# Patient Record
Sex: Female | Born: 1935 | Race: Black or African American | Hispanic: No | Smoking: Never smoker
Health system: Southern US, Community
[De-identification: ages and names within clinical notes are randomized; demographics above are authoritative.]

## PROBLEM LIST (undated history)

## (undated) DIAGNOSIS — M1990 Unspecified osteoarthritis, unspecified site: Secondary | ICD-10-CM

## (undated) DIAGNOSIS — F32A Depression, unspecified: Secondary | ICD-10-CM

## (undated) DIAGNOSIS — R51 Headache: Secondary | ICD-10-CM

## (undated) DIAGNOSIS — D649 Anemia, unspecified: Secondary | ICD-10-CM

## (undated) DIAGNOSIS — H409 Unspecified glaucoma: Secondary | ICD-10-CM

## (undated) DIAGNOSIS — I10 Essential (primary) hypertension: Secondary | ICD-10-CM

## (undated) DIAGNOSIS — K219 Gastro-esophageal reflux disease without esophagitis: Secondary | ICD-10-CM

## (undated) DIAGNOSIS — H548 Legal blindness, as defined in USA: Secondary | ICD-10-CM

## (undated) DIAGNOSIS — G20A1 Parkinson's disease without dyskinesia, without mention of fluctuations: Secondary | ICD-10-CM

## (undated) DIAGNOSIS — H269 Unspecified cataract: Secondary | ICD-10-CM

## (undated) DIAGNOSIS — G20 Parkinson's disease: Secondary | ICD-10-CM

## (undated) DIAGNOSIS — F329 Major depressive disorder, single episode, unspecified: Secondary | ICD-10-CM

## (undated) HISTORY — PX: ABDOMINAL HYSTERECTOMY: SHX81

## (undated) HISTORY — PX: EYE SURGERY: SHX253

## (undated) HISTORY — PX: COLONOSCOPY: SHX174

---

## 2004-04-27 ENCOUNTER — Ambulatory Visit: Payer: Self-pay | Admitting: Family Medicine

## 2005-08-01 ENCOUNTER — Ambulatory Visit: Payer: Self-pay | Admitting: Family Medicine

## 2005-10-20 ENCOUNTER — Emergency Department: Payer: Self-pay

## 2005-10-20 ENCOUNTER — Other Ambulatory Visit: Payer: Self-pay

## 2006-09-18 ENCOUNTER — Ambulatory Visit: Payer: Self-pay | Admitting: Family Medicine

## 2009-07-27 ENCOUNTER — Ambulatory Visit: Payer: Self-pay | Admitting: Emergency Medicine

## 2009-11-08 ENCOUNTER — Inpatient Hospital Stay: Payer: Self-pay | Admitting: Internal Medicine

## 2009-11-14 LAB — PATHOLOGY REPORT

## 2012-05-29 LAB — COMPREHENSIVE METABOLIC PANEL
Albumin: 3.4 g/dL (ref 3.4–5.0)
Alkaline Phosphatase: 59 U/L (ref 50–136)
Bilirubin,Total: 0.4 mg/dL (ref 0.2–1.0)
Calcium, Total: 10 mg/dL (ref 8.5–10.1)
Chloride: 110 mmol/L — ABNORMAL HIGH (ref 98–107)
Co2: 27 mmol/L (ref 21–32)
Creatinine: 1.01 mg/dL (ref 0.60–1.30)
EGFR (African American): >60
EGFR (Non-African Amer.): 53 — ABNORMAL LOW
Osmolality: 285 (ref 275–301)
Potassium: 4.3 mmol/L (ref 3.5–5.1)
SGPT (ALT): 25 U/L (ref 12–78)
Total Protein: 7.2 g/dL (ref 6.4–8.2)

## 2012-05-29 LAB — CBC
HGB: 14.1 g/dL (ref 12.0–16.0)
MCHC: 33.1 g/dL (ref 32.0–36.0)
MCV: 95 fL (ref 80–100)
Platelet: 160 10*3/uL (ref 150–440)
RDW: 15.7 % — ABNORMAL HIGH (ref 11.5–14.5)

## 2012-05-29 LAB — URINALYSIS, COMPLETE
Ph: 5 (ref 4.5–8.0)
RBC,UR: 1 /HPF (ref 0–5)

## 2012-05-30 ENCOUNTER — Inpatient Hospital Stay: Payer: Self-pay | Admitting: Internal Medicine

## 2012-05-30 LAB — HEMATOCRIT: HCT: 40.4 % (ref 35.0–47.0)

## 2012-05-30 LAB — HEMOGLOBIN: HGB: 13.5 g/dL (ref 12.0–16.0)

## 2012-05-31 LAB — BASIC METABOLIC PANEL
Anion Gap: 3 — ABNORMAL LOW (ref 7–16)
Creatinine: 0.93 mg/dL (ref 0.60–1.30)
EGFR (Non-African Amer.): 58 — ABNORMAL LOW
Glucose: 86 mg/dL (ref 65–99)
Osmolality: 280 (ref 275–301)
Potassium: 3.7 mmol/L (ref 3.5–5.1)
Sodium: 140 mmol/L (ref 136–145)

## 2012-05-31 LAB — CBC WITH DIFFERENTIAL/PLATELET
Basophil %: 0.7 %
Eosinophil #: 0.2 10*3/uL (ref 0.0–0.7)
Eosinophil %: 3.1 %
HGB: 12.6 g/dL (ref 12.0–16.0)
Lymphocyte #: 2.1 10*3/uL (ref 1.0–3.6)
Lymphocyte %: 40.4 %
MCH: 31.6 pg (ref 26.0–34.0)
MCHC: 33.3 g/dL (ref 32.0–36.0)
MCV: 95 fL (ref 80–100)
Monocyte #: 0.5 x10 3/mm (ref 0.2–0.9)
Monocyte %: 9.6 %
Neutrophil #: 2.4 10*3/uL (ref 1.4–6.5)
Neutrophil %: 46.2 %
Platelet: 144 10*3/uL — ABNORMAL LOW (ref 150–440)
RDW: 15.5 % — ABNORMAL HIGH (ref 11.5–14.5)
WBC: 5.1 10*3/uL (ref 3.6–11.0)

## 2013-08-20 ENCOUNTER — Encounter (HOSPITAL_COMMUNITY): Payer: Self-pay | Admitting: Pharmacy Technician

## 2013-08-28 ENCOUNTER — Other Ambulatory Visit: Payer: Self-pay | Admitting: Ophthalmology

## 2013-08-28 MED ORDER — TETRACAINE HCL 0.5 % OP SOLN: 1.0000 [drp] | OPHTHALMIC | Status: DC

## 2013-08-28 NOTE — H&P (Signed)
History & Physical:   DATE:   08-04-13  NAME:  Lydia Church, Lydia Church     [phone]       HISTORY OF PRESENT ILLNESS: Referred By Dr.A.Hedy Camaraeid Patty, Jr.    Chief Eye Complaints glaucoma  patient states that  vision is clearer some days than other OD Watery  Recently DX w/ Parkinson Disease        HPI: EYES: Reports symptoms of SEE CATARACT QUESTIONNAIRE      LOCATION:   BOTH EYES        QUALITY/COURSE:   Reports condition is worsening.        INTENSITY/SEVERITY:    Reports measurement ( or degree) as severe .      DURATION:   Reports the general length of symptoms to be years.     ACTIVE PROBLEMS: Dry eye syndrome   ICD10: H04.129  ICD9: 375.15  Onset: 08/04/2013 14:45  Initial Date:    Blindness, one eye, low vision other eye   ICD9: 369.10  Onset: 04/05/2013 11:12  Initial Date:   ICD10: H54.10  Primary open angle glaucoma   ICD9: 365.11  Onset: 04/05/2013 10:11  Initial Date:    Open-angle glaucoma, unspecified   ICD9: 365.10  Onset: 04/05/2013 10:16  Initial Date:   ICD10:      Glaucoma, severe stage   ICD9: 365.73  ICD10:   intraocular pressure too high left eye ICD10:    Parkinson's disease NOS   ICD10:   ICD9: 332.0  Onset: 08/04/2013 14:46  Initial Date:    Nuclear cataract NOS   ICD9: 366.04  Onset: 04/05/2013 10:11  Initial Date:     ICD10:  Benign hypertension   ICD9: 401.1  Onset: 04/05/2013 10:16  Initial Date:   ICD10: I10  SURGERIES: gLAUCOMA SURGERY OD  MEDICATIONS: ANTIHYPERTENSIVE Sinemet (Carbidopa/Levodopa):   10 mg-100 mg tablet  SIG-  2 tab(s)   4 times a day   Neptazane: 50 mg tablet SIG-  1 tab(s) orally 2 times a day for 30 days  Combigan: Strength-  SIG-  1 gtt OU BID  BID OU  Lumigan: Strength-  SIG-  1 gtt OU QHS   Ciloxan (Ciprofloxacin) Solution: 0.3% solution SIG-  1 drop in affected eye twice a day  REVIEW OF SYSTEMS: ROS:   GEN- Constitutional: HENT: GEN - Endocrine: Reports symptoms of diabetes.   suspect LUNGS/Respiratory:   HEART/Cardiovascular: Reports symptoms of hypertension.    ABD/Gastrointestinal:  Musculoskeletal (BJE): NEURO/Neurological: PSYCH/Psychiatric:    Is the pt oriented to time, place, person? yes Mood depressed  normal  TOBACCO: Never smoker   ICD9: V13.89 Onset: 04/05/2013 10:07 Initial Date:   Tobacco use:             SOCIAL HISTORY: RETIRED  FAMILY HISTORY: Family History - 1st Degree Relatives:  Mother dead.    ALLERGIES: Diamox cause Diarrhea.  PHYSICAL EXAMINATION: VS: BMI: 39.1.  BP: 130/86.  H: 60.00 in.  RR: 20 /min.  W: 200lbs 0oz.    Va:08/04/2013 14:29     OD:cc 20/NLP  OS:cc 20/40 +2    PH 20/NI  EYEGLASSES:  UJ:WJXBJYNOD:Balance Lens  OS:-0.25+0.50x178  ADD:2.75  MR   WU:JWJXBJY Lens OS:+0.75-1.00x106 ADD:+2.75  K's Reading 08/04/2013 14:50  OD:41.75.44.75 OS:45.00,46.25  VF:   OD:   Loss of vision in all four quadrants                                            OS: Severe constriction  Motility: full versions with slight exotropia OD  PUPILS: +2 afferent pupillary defect OD reactive OS  EYELIDS & OCULAR ADNEXA:   SLE: Conjunctiva: elevated bleb with extension onto cornea OD, +2 injection with follicles each eye  Cornea: arcus with decrease tear film each eye +2 central staining OS   anterior chamber  deep and quiet each eye  Iris: Brown each eye with no rubeosis either eye  Lens: Plus to 3 nuclear sclerosis OD, +2 Nuclear Sclerosis  Ta   in mmHg:    OD:  16         OS: 23.24   Time 08/04/2013 15:28   Gonio: OS angle open to 60 to scleral spur with +2-3 pigment high iris insertion   Dilation:   Fundus: optic nerve   OD   cOMPLETE TEMPORAL RIM LOSS                                                OS  Inferior rim loss 90% cup   Macula       OD:  Clear                 OS: Clear  Vessels: narrow arterioles  Periphery: normal     Exam: GENERAL: Appearance: HEAD, EARS, NOSE AND THROAT:  Ears-Nose (external) Inspection: Externally, nose and ears are normal in appearance and without scars, lesions, or nodules.      Hearing assessment shows no problems with normal conversation.      LUNGS and RESPIRATORY: Lung auscultation elicits no wheezing, rhonci, rales or rubs and with equal breath sounds.    Respiratory effort described as breathing is unlabored and chest movement is symmetrical.    HEART (Cardiovascular): Heart auscultation discovers regular rate and rhythm; no murmur, gallop or rub. Normal heart sounds.    ABDOMEN (Gastrointestinal): Mass/Tenderness Exam: Neither are present.     MUSCULOSKELETAL (BJE): Inspection-Palpation: No major bone, joint, tendon, or muscle changes.      NEUROLOGICAL: Alert and oriented. No major deficits of coordination or sensation.      PSYCHIATRIC: Insight and judgment appear  both to be intact and appropriate.    Mood and affect are described as normal mood and full affect.    SKIN: Skin Inspection: No rashes or lesions  ADMITTING DIAGNOSIS:Primary open angle glaucoma   ICD9: 365.11  Onset: 04/05/2013 10:11  Initial Date:     Glaucoma, severe stage   ICD9: 365.73   intraocular pressure too high left eye   Nuclear cataract NOS   ICD9: 366.04  LEFT EYE Dry eye syndrome   ICD10: H04.129  ICD9: 375.15  Onset: 08/04/2013 14:45  Initial Date:    Blindness, one eye, low vision other eye   ICD9: 369.10  Onset: 04/05/2013 11:12  Initial Date:     Parkinson's disease NOS   ICD10:   ICD9: 332.0  Onset: 08/04/2013 14:46  Initial Date:    Benign hypertension   ICD9: 401.1  Onset: 04/05/2013 10:16  Initial Date:  SURGICAL TREATMENT PLAN: phaco emulsion cataract extraction  w  intraocular lens implant  and glaucoma surgery  trabeculectomy   OS    WITH EXPRESS MINI-TUBE  Risk and benefits of Cataract and Glaucoma surgery have been reviewed with the patient, suggested that  patient  goes with glaucoma surgery if gtts and pills doesn't work.       ___________________________ Chalmers Guest, Montez Hageman Starter - Inactive Problems:

## 2013-08-31 ENCOUNTER — Encounter (HOSPITAL_COMMUNITY): Payer: Self-pay | Admitting: *Deleted

## 2013-08-31 MED ORDER — CYCLOPENTOLATE HCL 1 % OP SOLN
1.0000 [drp] | OPHTHALMIC | Status: AC
  Administered 2013-09-01: 1 [drp] via OPHTHALMIC
  Filled 2013-08-31: qty 2

## 2013-08-31 MED ORDER — KETOROLAC TROMETHAMINE 0.5 % OP SOLN
1.0000 [drp] | OPHTHALMIC | Status: AC
  Administered 2013-09-01: 1 [drp] via OPHTHALMIC
  Filled 2013-08-31: qty 5

## 2013-08-31 MED ORDER — TROPICAMIDE 1 % OP SOLN
1.0000 [drp] | OPHTHALMIC | Status: AC
  Administered 2013-09-01: 1 [drp] via OPHTHALMIC
  Filled 2013-08-31: qty 3

## 2013-08-31 MED ORDER — GATIFLOXACIN 0.5 % OP SOLN
1.0000 [drp] | OPHTHALMIC | Status: AC | PRN
  Administered 2013-09-01 (×3): 1 [drp] via OPHTHALMIC
  Filled 2013-08-31: qty 2.5

## 2013-08-31 MED ORDER — PHENYLEPHRINE HCL 2.5 % OP SOLN
1.0000 [drp] | OPHTHALMIC | Status: AC
  Administered 2013-09-01: 1 [drp] via OPHTHALMIC
  Filled 2013-08-31: qty 2

## 2013-08-31 NOTE — Progress Notes (Signed)
Pt[place]

## 2013-09-01 ENCOUNTER — Ambulatory Visit (HOSPITAL_COMMUNITY): Payer: Medicare Other

## 2013-09-01 ENCOUNTER — Ambulatory Visit (HOSPITAL_COMMUNITY)
Admission: RE | Admit: 2013-09-01 | Discharge: 2013-09-01 | Disposition: A | Discharge status: Home / Self Care | Payer: Medicare Other | Source: Ambulatory Visit | Attending: Ophthalmology | Admitting: Ophthalmology

## 2013-09-01 ENCOUNTER — Encounter (HOSPITAL_COMMUNITY): Payer: Medicare Other | Admitting: Anesthesiology

## 2013-09-01 ENCOUNTER — Encounter (HOSPITAL_COMMUNITY)
Admission: RE | Disposition: A | Discharge status: Home / Self Care | Payer: Self-pay | Source: Ambulatory Visit | Attending: Ophthalmology

## 2013-09-01 ENCOUNTER — Encounter (HOSPITAL_COMMUNITY): Payer: Self-pay | Admitting: *Deleted

## 2013-09-01 ENCOUNTER — Ambulatory Visit (HOSPITAL_COMMUNITY): Payer: Medicare Other | Admitting: Anesthesiology

## 2013-09-01 DIAGNOSIS — H04129 Dry eye syndrome of unspecified lacrimal gland: Secondary | ICD-10-CM | POA: Diagnosis not present

## 2013-09-01 DIAGNOSIS — H2510 Age-related nuclear cataract, unspecified eye: Secondary | ICD-10-CM | POA: Diagnosis present

## 2013-09-01 DIAGNOSIS — H4011X0 Primary open-angle glaucoma, stage unspecified: Secondary | ICD-10-CM | POA: Diagnosis not present

## 2013-09-01 DIAGNOSIS — G20A1 Parkinson's disease without dyskinesia, without mention of fluctuations: Secondary | ICD-10-CM | POA: Insufficient documentation

## 2013-09-01 DIAGNOSIS — H409 Unspecified glaucoma: Secondary | ICD-10-CM | POA: Insufficient documentation

## 2013-09-01 DIAGNOSIS — G20 Parkinson's disease: Secondary | ICD-10-CM | POA: Insufficient documentation

## 2013-09-01 HISTORY — DX: Unspecified glaucoma: H40.9

## 2013-09-01 HISTORY — DX: Unspecified cataract: H26.9

## 2013-09-01 HISTORY — PX: TRABECULECTOMY: SHX107

## 2013-09-01 HISTORY — PX: CATARACT EXTRACTION W/PHACO: SHX586

## 2013-09-01 HISTORY — DX: Gastro-esophageal reflux disease without esophagitis: K21.9

## 2013-09-01 HISTORY — PX: MITOMYCIN C APPLICATION: SHX6375

## 2013-09-01 HISTORY — DX: Depression, unspecified: F32.A

## 2013-09-01 HISTORY — DX: Parkinson's disease without dyskinesia, without mention of fluctuations: G20.A1

## 2013-09-01 HISTORY — DX: Parkinson's disease: G20

## 2013-09-01 HISTORY — DX: Essential (primary) hypertension: I10

## 2013-09-01 HISTORY — DX: Unspecified osteoarthritis, unspecified site: M19.90

## 2013-09-01 HISTORY — DX: Legal blindness, as defined in USA: H54.8

## 2013-09-01 HISTORY — DX: Headache: R51

## 2013-09-01 HISTORY — DX: Major depressive disorder, single episode, unspecified: F32.9

## 2013-09-01 HISTORY — DX: Anemia, unspecified: D64.9

## 2013-09-01 LAB — BASIC METABOLIC PANEL
Anion gap: 10 (ref 5–15)
BUN: 20 mg/dL (ref 6–23)
CHLORIDE: 107 meq/L (ref 96–112)
CO2: 21 meq/L (ref 19–32)
Calcium: 9.6 mg/dL (ref 8.4–10.5)
Creatinine, Ser: 0.95 mg/dL (ref 0.50–1.10)
GFR calc Af Amer: 64 mL/min — ABNORMAL LOW (ref >90)
GFR, EST NON AFRICAN AMERICAN: 55 mL/min — AB (ref >90)
GLUCOSE: 93 mg/dL (ref 70–99)
POTASSIUM: 5.7 meq/L — AB (ref 3.7–5.3)
Sodium: 138 mEq/L (ref 137–147)

## 2013-09-01 LAB — CBC
HCT: 43.7 % (ref 36.0–46.0)
Hemoglobin: 13.8 g/dL (ref 12.0–15.0)
MCH: 31.2 pg (ref 26.0–34.0)
MCHC: 31.6 g/dL (ref 30.0–36.0)
MCV: 98.6 fL (ref 78.0–100.0)
PLATELETS: 165 10*3/uL (ref 150–400)
RBC: 4.43 MIL/uL (ref 3.87–5.11)
RDW: 16.6 % — AB (ref 11.5–15.5)
WBC: 4 10*3/uL (ref 4.0–10.5)

## 2013-09-01 SURGERY — TRABECULECTOMY
Anesthesia: Monitor Anesthesia Care | Site: Eye | Laterality: Left

## 2013-09-01 MED ORDER — FLUORESCEIN SODIUM 1 MG OP STRP
ORAL_STRIP | OPHTHALMIC | Status: DC | PRN
  Administered 2013-09-01: 1 via OPHTHALMIC

## 2013-09-01 MED ORDER — PROPOFOL 10 MG/ML IV BOLUS
INTRAVENOUS | Status: AC
  Filled 2013-09-01: qty 20

## 2013-09-01 MED ORDER — FENTANYL CITRATE 0.05 MG/ML IJ SOLN
INTRAMUSCULAR | Status: DC | PRN
  Administered 2013-09-01: 50 ug via INTRAVENOUS
  Administered 2013-09-01 (×2): 25 ug via INTRAVENOUS

## 2013-09-01 MED ORDER — CYCLOPENTOLATE HCL 1 % OP SOLN
1.0000 [drp] | OPHTHALMIC | Status: AC
  Administered 2013-09-01 (×2): 1 [drp] via OPHTHALMIC
  Filled 2013-09-01: qty 2

## 2013-09-01 MED ORDER — BSS IO SOLN
INTRAOCULAR | Status: DC | PRN
  Administered 2013-09-01: 500 mL via INTRAOCULAR

## 2013-09-01 MED ORDER — FLUORESCEIN SODIUM 1 MG OP STRP
ORAL_STRIP | OPHTHALMIC | Status: AC
  Filled 2013-09-01: qty 1

## 2013-09-01 MED ORDER — BSS IO SOLN
INTRAOCULAR | Status: AC
  Filled 2013-09-01: qty 500

## 2013-09-01 MED ORDER — 0.9 % SODIUM CHLORIDE (POUR BTL) OPTIME
TOPICAL | Status: DC | PRN
  Administered 2013-09-01: 1000 mL

## 2013-09-01 MED ORDER — LIDOCAINE HCL (CARDIAC) 20 MG/ML IV SOLN
INTRAVENOUS | Status: AC
  Filled 2013-09-01: qty 5

## 2013-09-01 MED ORDER — TROPICAMIDE 1 % OP SOLN
1.0000 [drp] | OPHTHALMIC | Status: AC
  Administered 2013-09-01 (×2): 1 [drp] via OPHTHALMIC
  Filled 2013-09-01: qty 2

## 2013-09-01 MED ORDER — LIDOCAINE HCL (CARDIAC) 20 MG/ML IV SOLN
INTRAVENOUS | Status: DC | PRN
  Administered 2013-09-01: 50 mg via INTRAVENOUS

## 2013-09-01 MED ORDER — TRIAMCINOLONE ACETONIDE 40 MG/ML IJ SUSP
INTRAMUSCULAR | Status: AC
  Filled 2013-09-01: qty 5

## 2013-09-01 MED ORDER — NA CHONDROIT SULF-NA HYALURON 40-30 MG/ML IO SOLN
INTRAOCULAR | Status: AC
  Filled 2013-09-01: qty 0.5

## 2013-09-01 MED ORDER — ONDANSETRON HCL 4 MG/2ML IJ SOLN
INTRAMUSCULAR | Status: DC | PRN
  Administered 2013-09-01: 4 mg via INTRAVENOUS

## 2013-09-01 MED ORDER — PHENYLEPHRINE HCL 10 MG/ML IJ SOLN
INTRAMUSCULAR | Status: DC | PRN
  Administered 2013-09-01 (×2): 40 ug via INTRAVENOUS
  Administered 2013-09-01: 80 ug via INTRAVENOUS

## 2013-09-01 MED ORDER — SODIUM CHLORIDE 0.9 % IV SOLN
INTRAVENOUS | Status: DC
  Administered 2013-09-01: 11:00:00 via INTRAVENOUS

## 2013-09-01 MED ORDER — KETOROLAC TROMETHAMINE 0.5 % OP SOLN
1.0000 [drp] | OPHTHALMIC | Status: AC
  Administered 2013-09-01 (×2): 1 [drp] via OPHTHALMIC
  Filled 2013-09-01: qty 3

## 2013-09-01 MED ORDER — ACETYLCHOLINE CHLORIDE 1:100 IO SOLR
INTRAOCULAR | Status: DC | PRN
  Administered 2013-09-01: 10 mg via INTRAOCULAR

## 2013-09-01 MED ORDER — FLUORESCEIN SODIUM 1 MG OP STRP
ORAL_STRIP | OPHTHALMIC | Status: AC
  Filled 2013-09-01: qty 2

## 2013-09-01 MED ORDER — LIDOCAINE-EPINEPHRINE 2 %-1:100000 IJ SOLN
INTRAMUSCULAR | Status: DC | PRN
  Administered 2013-09-01: 14:00:00 via RETROBULBAR

## 2013-09-01 MED ORDER — TRIAMCINOLONE ACETONIDE 40 MG/ML IJ SUSP
INTRAMUSCULAR | Status: DC | PRN
  Administered 2013-09-01: .1 mL

## 2013-09-01 MED ORDER — BALANCED SALT IO SOLN
INTRAOCULAR | Status: DC | PRN
  Administered 2013-09-01: 15 mL via INTRAOCULAR

## 2013-09-01 MED ORDER — NA CHONDROIT SULF-NA HYALURON 40-30 MG/ML IO SOLN
INTRAOCULAR | Status: DC | PRN
  Administered 2013-09-01: 0.75 mL via INTRAOCULAR

## 2013-09-01 MED ORDER — SODIUM HYALURONATE 10 MG/ML IO SOLN
INTRAOCULAR | Status: DC | PRN
  Administered 2013-09-01: 0.85 mL via INTRAOCULAR

## 2013-09-01 MED ORDER — PHENYLEPHRINE 40 MCG/ML (10ML) SYRINGE FOR IV PUSH (FOR BLOOD PRESSURE SUPPORT)
PREFILLED_SYRINGE | INTRAVENOUS | Status: AC
  Filled 2013-09-01: qty 10

## 2013-09-01 MED ORDER — LIDOCAINE-EPINEPHRINE 2 %-1:100000 IJ SOLN
INTRAMUSCULAR | Status: AC
  Filled 2013-09-01: qty 1

## 2013-09-01 MED ORDER — FENTANYL CITRATE 0.05 MG/ML IJ SOLN
INTRAMUSCULAR | Status: AC
  Filled 2013-09-01: qty 5

## 2013-09-01 MED ORDER — SODIUM HYALURONATE 10 MG/ML IO SOLN
INTRAOCULAR | Status: AC
  Filled 2013-09-01: qty 0.85

## 2013-09-01 MED ORDER — MITOMYCIN 0.2 MG OP KIT
0.2000 mg | PACK | OPHTHALMIC | Status: AC
  Administered 2013-09-01: 0.2 mg via OPHTHALMIC
  Filled 2013-09-01: qty 1

## 2013-09-01 MED ORDER — ONDANSETRON HCL 4 MG/2ML IJ SOLN
INTRAMUSCULAR | Status: AC
  Filled 2013-09-01: qty 2

## 2013-09-01 MED ORDER — PROPOFOL 10 MG/ML IV BOLUS
INTRAVENOUS | Status: DC | PRN
  Administered 2013-09-01: 40 mg via INTRAVENOUS

## 2013-09-01 MED ORDER — TOBRAMYCIN 0.3 % OP OINT
TOPICAL_OINTMENT | OPHTHALMIC | Status: DC | PRN
  Administered 2013-09-01: 1 via OPHTHALMIC

## 2013-09-01 MED ORDER — SODIUM CHLORIDE 0.9 % IV SOLN
INTRAVENOUS | Status: DC | PRN
  Administered 2013-09-01 (×2): via INTRAVENOUS

## 2013-09-01 MED ORDER — PHENYLEPHRINE HCL 2.5 % OP SOLN
1.0000 [drp] | OPHTHALMIC | Status: AC
  Administered 2013-09-01 (×2): 1 [drp] via OPHTHALMIC
  Filled 2013-09-01: qty 2

## 2013-09-01 MED ORDER — PROPOFOL INFUSION 10 MG/ML OPTIME
INTRAVENOUS | Status: DC | PRN
Start: 2013-09-01 — End: 2013-09-01
  Administered 2013-09-01: 25 ug/kg/min via INTRAVENOUS

## 2013-09-01 SURGICAL SUPPLY — 52 items
APPLICATOR COTTON TIP 6IN STRL (MISCELLANEOUS) ×3 IMPLANT
APPLICATOR DR MATTHEWS STRL (MISCELLANEOUS) ×3 IMPLANT
BLADE 10 SAFETY STRL DISP (BLADE) ×3 IMPLANT
BLADE EYE CATARACT 19 1.4 BEAV (BLADE) ×3 IMPLANT
BLADE KERATOME 2.75 (BLADE) ×2 IMPLANT
BLADE KERATOME 2.75MM (BLADE) ×1
BLADE MINI RND TIP GREEN BEAV (BLADE) ×3 IMPLANT
BLADE NEEDLE 3 SS STRL (BLADE) ×2 IMPLANT
BLADE NEEDLE 3MM SS STRL (BLADE) ×1
BLADE STAB KNIFE 45DEG (BLADE) ×3 IMPLANT
CANISTER SUCTION 2500CC (MISCELLANEOUS) IMPLANT
CANNULA ANTERIOR CHAMBER 27GA (MISCELLANEOUS) ×3 IMPLANT
CORDS BIPOLAR (ELECTRODE) ×3 IMPLANT
COVER MAYO STAND STRL (DRAPES) IMPLANT
DRAPE OPHTHALMIC 40X48 W POUCH (DRAPES) ×3 IMPLANT
DRAPE RETRACTOR (MISCELLANEOUS) ×3 IMPLANT
ERASER HMR WETFIELD 23G BP (MISCELLANEOUS) ×3 IMPLANT
GLOVE BIO SURGEON STRL SZ7.5 (GLOVE) ×6 IMPLANT
GLOVE BIO SURGEON STRL SZ8 (GLOVE) ×6 IMPLANT
GLOVE ECLIPSE 7.0 STRL STRAW (GLOVE) ×3 IMPLANT
GLOVE SURG SS PI 7.0 STRL IVOR (GLOVE) ×3 IMPLANT
GOWN STRL REIN XL XLG (GOWN DISPOSABLE) ×9 IMPLANT
GOWN STRL REUS W/ TWL LRG LVL3 (GOWN DISPOSABLE) ×2 IMPLANT
GOWN STRL REUS W/ TWL XL LVL3 (GOWN DISPOSABLE) ×1 IMPLANT
GOWN STRL REUS W/TWL LRG LVL3 (GOWN DISPOSABLE) ×4
GOWN STRL REUS W/TWL XL LVL3 (GOWN DISPOSABLE) ×2
KIT BASIN OR (CUSTOM PROCEDURE TRAY) ×3 IMPLANT
KIT ROOM TURNOVER OR (KITS) ×3 IMPLANT
KNIFE GRIESHABER SHARP 2.5MM (MISCELLANEOUS) ×3 IMPLANT
LENS IOL ACRSF IQ PC 19.5 (Intraocular Lens) ×1 IMPLANT
LENS IOL ACRYSOF IQ POST 19.5 (Intraocular Lens) ×3 IMPLANT
NEEDLE 25GX 5/8IN NON SAFETY (NEEDLE) ×3 IMPLANT
NEEDLE HYPO 30X.5 LL (NEEDLE) ×3 IMPLANT
NS IRRIG 1000ML POUR BTL (IV SOLUTION) ×3 IMPLANT
PACK CATARACT CUSTOM (CUSTOM PROCEDURE TRAY) ×6 IMPLANT
PAD ARMBOARD 7.5X6 YLW CONV (MISCELLANEOUS) ×6 IMPLANT
SHUNT EXPRS GLAUCOMA MINI P200 (Intraocular Lens) ×3 IMPLANT
SPEAR EYE SURG WECK-CEL (MISCELLANEOUS) ×3 IMPLANT
SPECIMEN JAR SMALL (MISCELLANEOUS) IMPLANT
SUT ETHILON 10 0 CS140 6 (SUTURE) ×3 IMPLANT
SUT ETHILON 9 0 BV100 4 (SUTURE) ×3 IMPLANT
SUT SILK 6 0 G 6 (SUTURE) ×3 IMPLANT
SUT VICRYL 9-0 (SUTURE) ×3 IMPLANT
SYR 20CC LL (SYRINGE) ×6 IMPLANT
SYR 50ML SLIP (SYRINGE) ×3 IMPLANT
SYR TB 1ML LUER SLIP (SYRINGE) IMPLANT
TIP ABS 45DEG FLARED 0.9MM (TIP) ×3 IMPLANT
TOWEL OR 17X24 6PK STRL BLUE (TOWEL DISPOSABLE) ×6 IMPLANT
TUBE CONNECTING 12'X1/4 (SUCTIONS)
TUBE CONNECTING 12X1/4 (SUCTIONS) IMPLANT
WATER STERILE IRR 1000ML POUR (IV SOLUTION) ×3 IMPLANT
WIPE INSTRUMENT VISIWIPE 73X73 (MISCELLANEOUS) ×6 IMPLANT

## 2013-09-01 NOTE — Progress Notes (Signed)
09/01/13 1008  OBSTRUCTIVE SLEEP APNEA  Score 4 or greater  Results sent to PCP

## 2013-09-01 NOTE — Anesthesia Postprocedure Evaluation (Signed)
  Anesthesia Post-op Note  Patient: Lydia Church  Procedure(s) Performed: Procedure(s): TRABECULECTOMY LEFT EYE (Left) MITOMYCIN C APPLICATION (Left) CATARACT EXTRACTION PHACO AND INTRAOCULAR LENS PLACEMENT (IOC) (Left)  Patient Location: PACU  Anesthesia Type:MAC  Level of Consciousness: awake  Airway and Oxygen Therapy: Patient Spontanous Breathing  Post-op Pain: mild  Post-op Assessment: Post-op Vital signs reviewed  Post-op Vital Signs: Reviewed  Last Vitals:  Filed Vitals:   09/01/13 0908  BP: 167/58  Pulse: 50  Temp: 36.2 C  Resp: 18    Complications: No apparent anesthesia complications

## 2013-09-01 NOTE — Anesthesia Preprocedure Evaluation (Addendum)
Anesthesia Evaluation  Patient identified by MRN, date of birth, ID band Patient awake    Reviewed: Allergy & Precautions, H&P , NPO status , Patient's Chart, lab work & pertinent test results  Airway Mallampati: I TM Distance: >3 FB Neck ROM: Full    Dental  (+) Dental Advisory Given, Poor Dentition   Pulmonary neg pulmonary ROS,  breath sounds clear to auscultation        Cardiovascular hypertension, Pt. on medications Rhythm:Regular Rate:Normal     Neuro/Psych  Headaches, Pt has an involuntary gnawing motion of mouth and lower jaw...    GI/Hepatic Neg liver ROS, GERD-  ,Occasional gas and acid if eats spicy food   Endo/Other    Renal/GU negative Renal ROS     Musculoskeletal   Abdominal   Peds  Hematology   Anesthesia Other Findings Pt c/o lower right molar hurting ; many broken, worn, and missing  Reproductive/Obstetrics                         Anesthesia Physical Anesthesia Plan  ASA: III  Anesthesia Plan: MAC   Post-op Pain Management:    Induction: Intravenous  Airway Management Planned: Simple Face Mask  Additional Equipment:   Intra-op Plan:   Post-operative Plan:   Informed Consent: I have reviewed the patients History and Physical, chart, labs and discussed the procedure including the risks, benefits and alternatives for the proposed anesthesia with the patient or authorized representative who has indicated his/her understanding and acceptance.   Dental advisory given  Plan Discussed with: CRNA, Anesthesiologist and Surgeon  Anesthesia Plan Comments:         Anesthesia Quick Evaluation

## 2013-09-01 NOTE — Progress Notes (Signed)
09/01/13 1005  OBSTRUCTIVE SLEEP APNEA  Have you ever been diagnosed with sleep apnea through a sleep study? No  Do you snore loudly (loud enough to be heard through closed doors)?  0  Do you often feel tired, fatigued, or sleepy during the daytime? 1  Has anyone observed you stop breathing during your sleep? 0  Do you have, or are you being treated for high blood pressure? 1  BMI more than 35 kg/m2? 1  Age over 78 years old? 1  Neck circumference greater than 40 cm/16 inches? 0  Obstructive Sleep Apnea Score 4  Score 4 or greater  Results sent to PCP

## 2013-09-01 NOTE — Anesthesia Postprocedure Evaluation (Signed)
  Anesthesia Post-op Note  Patient: Lydia Church  Procedure(s) Performed: Procedure(s): TRABECULECTOMY LEFT EYE (Left) MITOMYCIN C APPLICATION (Left) CATARACT EXTRACTION PHACO AND INTRAOCULAR LENS PLACEMENT (IOC) (Left)  Patient Location: PACU  Anesthesia Type:MAC  Level of Consciousness: awake  Airway and Oxygen Therapy: Patient Spontanous Breathing  Post-op Pain: mild  Post-op Assessment: Post-op Vital signs reviewed  Post-op Vital Signs: Reviewed  Last Vitals:  Filed Vitals:   09/01/13 1515  BP: 126/54  Pulse: 48  Temp:   Resp: 16    Complications: No apparent anesthesia complications

## 2013-09-01 NOTE — Interval H&P Note (Signed)
History and Physical Interval Note:  09/01/2013 12:19 PM  Lydia Church  has presented today for surgery, with the diagnosis of Nuclear cataract, nonsenile, left eye/Glaucoma left eye  The various methods of treatment have been discussed with the patient and family. After consideration of risks, benefits and other options for treatment, the patient has consented to  Procedure(s): TRABECULECTOMY LEFT EYE (Left) MITOMYCIN C APPLICATION (Left) as a surgical intervention .  The patient's history has been reviewed, patient examined, no change in status, stable for surgery.  I have reviewed the patient's chart and labs.  Questions were answered to the patient's satisfaction.     Sebrina Kessner

## 2013-09-01 NOTE — Discharge Instructions (Signed)
The patient may remove the eye patch on her eye at 5:00 today to not apply any pressure to the eye. The patient should where eyeglasses or sunglasses over her eye at all times avoid straining lifting bending. When the patient goes asleep at night she should use an eye shield a plastic eye she'll over the eye sleep on back or right side not on left eye. The patient notices pain she may take her usual pain medications this does not relieve the pain she should call the doctor's office tonight.

## 2013-09-01 NOTE — Transfer of Care (Signed)
Immediate Anesthesia Transfer of Care Note  Patient: Lydia Church  Procedure(s) Performed: Procedure(s): TRABECULECTOMY LEFT EYE (Left) MITOMYCIN C APPLICATION (Left) CATARACT EXTRACTION PHACO AND INTRAOCULAR LENS PLACEMENT (IOC) (Left)  Patient Location: PACU  Anesthesia Type:MAC  Level of Consciousness: awake, alert , oriented and patient cooperative  Airway & Oxygen Therapy: Patient Spontanous Breathing and Patient connected to nasal cannula oxygen  Post-op Assessment: Report given to PACU RN, Post -op Vital signs reviewed and stable and Patient moving all extremities  Post vital signs: Reviewed and stable  Complications: No apparent anesthesia complications

## 2013-09-01 NOTE — Interval H&P Note (Signed)
History and Physical Interval Note:  09/01/2013 12:17 PM  Lydia Church  has presented today for surgery, with the diagnosis of Nuclear cataract, nonsenile, left eye/Glaucoma left eye  The various methods of treatment have been discussed with the patient and family. After consideration of risks, benefits and other options for treatment, the patient has consented to  Procedure(s): TRABECULECTOMY LEFT EYE (Left) MITOMYCIN C APPLICATION (Left) as a surgical intervention .  The patient's history has been reviewed, patient examined, no change in status, stable for surgery.  I have reviewed the patient's chart and labs.  Questions were answered to the patient's satisfaction.     Thresia Ramanathan

## 2013-09-01 NOTE — Op Note (Signed)
Preoperative diagnosis: Uncontrolled glaucoma in visually significant cataract left eye Postoperative diagnosis: Same Procedure phacoemulsification with intraocular lens implant and insertion of or coma express device with mitomycin-C all left eye Anesthesia: 2% Xylocaine with epinephrine a 50-50 mixture 0.75% Marcaine with ample Wydase Complications: None Assistant: Milee Procedure: The patient transferred to the operating room where she was given a peribulbar block with the aforementioned local anesthetic agent. Following this the patient's face prepped and draped in the usual sterile fashion with a surgeon initially sitting temporally and the operating microscope in position a Weck-Cel sponges used to fixate the globe and a 15 blade was used to enter through inferior clear cornea Viscoat was injected into the anterior chamber additional Weck-Cel sponge was used to fixate the globe and a 2.75 mm keratome blade was used in a stepwise fashion through temporal clear cornea to into the chamber additional Viscoat was injected. A bent 25-gauge needle was used to incise anterior capsule and a continuous tear curvilinear capsulorrhexis was formed. BSS was used to hydrodissect and hydrodelineate the nucleus and the nucleus was noted to rise out of capsular bag. The phacoemulsification unit was then used to sculpt the nucleus into 4 quadrants quadrants were divided with a Kuglen hook and phaco tip all nuclear fragments were removed from the eye and the posterior capsule remained intact. The irrigation aspiration device was then used to strip cortical fibrous and the posterior capsule after all cortical fibers had been removed Provisc was injected in the anterior chamber. The intraocular lens implant was examined and noted to have no defects the lens was an Alcon AcrySof SN 60 WF IQ lens 19.5 diopter SN #[id number] the lens is placed in the lens injector and injected in the capsular bag. It was position with a  Kuglen hook the I/A was then used to remove a small amount of viscoelastic Miochol was injected in the eye and the pupil was noted to come down symmetrically round. A single 10-0 nylon suture was placed to achieve watertight closure the incision at this point the operating microscope was rotated to the 12:00 position a 6-0 nylon suture was passed through superior clear cornea to infraducted the eye. Following this the incision was made superior nasal using a shot Wescott scissors the blunt Wescott and then used to bluntly dissect forming a fornix-based conjunctival flap T9 fibers were recess use and agrees our blade bleeding was controlled with cautery following this using a 45 blade a half thickness scleral flap was fashioned using Colibri forceps and agrees our blade the scleral flap was dissected to the limbus following this mitomycin-C 0.4 mg per cc were placed onto Gelfoam sponges the sponges were placed and the the conjunctiva and allowed to stay on the eye for 3 minutes the sponges were then removed and eye was irrigated with 40 cc of balanced salt solution following this the scleral flap was elevated and using a 26-gauge needle attached to Provisc in the anterior chamber was entered with the needle beneath the scleral flap at the scleral spur. Provisc was injected in the eye. Following this the express glaucoma device was noted to have no defects it was taken from the package the express was a version P2 100 SN #[id number] the express device was positioned through the aforementioned tract made with a 26-gauge needle the scleral flap was then sutured with 3 interrupted 10-0 nylon sutures the conjunctiva was then closed with a 9-0 Vicryl suture on a BV 100 needle BSS was injected the paracentesis  flap to cause some of the Provisc to egress from the paracentesis tract. The bleb elevated the incision was Seidel negative therefore subconjunctival injection of Kenalog 4 mg is given in the inferior subconjunctival  space topical TobraDex ointment was applied to the eye all sutures instruments were removed from the eye a patch and Fox U. were placed and the patient returned to recovery area in stable condition [employer] Indra Wolters Junior

## 2013-09-01 NOTE — H&P (View-Only) (Signed)
                  History & Physical:   DATE:   08-04-13  NAME:  Church, Lydia M     [id number]       HISTORY OF PRESENT ILLNESS: Referred By Dr.A.Reid Patty, Jr.    Chief Eye Complaints glaucoma  patient states that  vision is clearer some days than other OD Watery  Recently DX w/ Parkinson Disease        HPI: EYES: Reports symptoms of SEE CATARACT QUESTIONNAIRE      LOCATION:   BOTH EYES        QUALITY/COURSE:   Reports condition is worsening.        INTENSITY/SEVERITY:    Reports measurement ( or degree) as severe .      DURATION:   Reports the general length of symptoms to be years.     ACTIVE PROBLEMS: Dry eye syndrome   ICD10: H04.129  ICD9: 375.15  Onset: 08/04/2013 14:45  Initial Date:    Blindness, one eye, low vision other eye   ICD9: 369.10  Onset: 04/05/2013 11:12  Initial Date:   ICD10: H54.10  Primary open angle glaucoma   ICD9: 365.11  Onset: 04/05/2013 10:11  Initial Date:    Open-angle glaucoma, unspecified   ICD9: 365.10  Onset: 04/05/2013 10:16  Initial Date:   ICD10:      Glaucoma, severe stage   ICD9: 365.73  ICD10:   intraocular pressure too high left eye ICD10:    Parkinson's disease NOS   ICD10:   ICD9: 332.0  Onset: 08/04/2013 14:46  Initial Date:    Nuclear cataract NOS   ICD9: 366.04  Onset: 04/05/2013 10:11  Initial Date:     ICD10:  Benign hypertension   ICD9: 401.1  Onset: 04/05/2013 10:16  Initial Date:   ICD10: I10  SURGERIES: gLAUCOMA SURGERY OD  MEDICATIONS: ANTIHYPERTENSIVE Sinemet (Carbidopa/Levodopa):   10 mg-100 mg tablet  SIG-  2 tab(s)   4 times a day   Neptazane: 50 mg tablet SIG-  1 tab(s) orally 2 times a day for 30 days  Combigan: Strength-  SIG-  1 gtt OU BID  BID OU  Lumigan: Strength-  SIG-  1 gtt OU QHS   Ciloxan (Ciprofloxacin) Solution: 0.3% solution SIG-  1 drop in affected eye twice a day  REVIEW OF SYSTEMS: ROS:   GEN- Constitutional: HENT: GEN - Endocrine: Reports symptoms of diabetes.   suspect LUNGS/Respiratory:   HEART/Cardiovascular: Reports symptoms of hypertension.    ABD/Gastrointestinal:  Musculoskeletal (BJE): NEURO/Neurological: PSYCH/Psychiatric:    Is the pt oriented to time, place, person? yes Mood depressed  normal  TOBACCO: Never smoker   ICD9: V13.89 Onset: 04/05/2013 10:07 Initial Date:   Tobacco use:             SOCIAL HISTORY: RETIRED  FAMILY HISTORY: Family History - 1st Degree Relatives:  Mother dead.    ALLERGIES: Diamox cause Diarrhea.  PHYSICAL EXAMINATION: VS: BMI: 39.1.  BP: 130/86.  H: 60.00 in.  RR: 20 /min.  W: 200lbs 0oz.    Va:08/04/2013 14:29     OD:cc 20/NLP  OS:cc 20/40 +2    PH 20/NI  EYEGLASSES:  OD:Balance Lens                                                 OS:-0.25+0.50x178  ADD:2.75  MR   OD:Balance Lens OS:+0.75-1.00x106 ADD:+2.75  K's Reading 08/04/2013 14:50  OD:41.75.44.75 OS:45.00,46.25  VF:   OD:   Loss of vision in all four quadrants                                            OS: Severe constriction  Motility: full versions with slight exotropia OD  PUPILS: +2 afferent pupillary defect OD reactive OS  EYELIDS & OCULAR ADNEXA:   SLE: Conjunctiva: elevated bleb with extension onto cornea OD, +2 injection with follicles each eye  Cornea: arcus with decrease tear film each eye +2 central staining OS   anterior chamber  deep and quiet each eye  Iris: Brown each eye with no rubeosis either eye  Lens: Plus to 3 nuclear sclerosis OD, +2 Nuclear Sclerosis  Ta   in mmHg:    OD:  16         OS: 23.24   Time 08/04/2013 15:28   Gonio: OS angle open to 60 to scleral spur with +2-3 pigment high iris insertion   Dilation:   Fundus: optic nerve   OD   cOMPLETE TEMPORAL RIM LOSS                                                OS  Inferior rim loss 90% cup   Macula       OD:  Clear                 OS: Clear  Vessels: narrow arterioles  Periphery: normal     Exam: GENERAL: Appearance: HEAD, EARS, NOSE AND THROAT:  Ears-Nose (external) Inspection: Externally, nose and ears are normal in appearance and without scars, lesions, or nodules.      Hearing assessment shows no problems with normal conversation.      LUNGS and RESPIRATORY: Lung auscultation elicits no wheezing, rhonci, rales or rubs and with equal breath sounds.    Respiratory effort described as breathing is unlabored and chest movement is symmetrical.    HEART (Cardiovascular): Heart auscultation discovers regular rate and rhythm; no murmur, gallop or rub. Normal heart sounds.    ABDOMEN (Gastrointestinal): Mass/Tenderness Exam: Neither are present.     MUSCULOSKELETAL (BJE): Inspection-Palpation: No major bone, joint, tendon, or muscle changes.      NEUROLOGICAL: Alert and oriented. No major deficits of coordination or sensation.      PSYCHIATRIC: Insight and judgment appear  both to be intact and appropriate.    Mood and affect are described as normal mood and full affect.    SKIN: Skin Inspection: No rashes or lesions  ADMITTING DIAGNOSIS:Primary open angle glaucoma   ICD9: 365.11  Onset: 04/05/2013 10:11  Initial Date:     Glaucoma, severe stage   ICD9: 365.73   intraocular pressure too high left eye   Nuclear cataract NOS   ICD9: 366.04  LEFT EYE Dry eye syndrome   ICD10: H04.129  ICD9: 375.15  Onset: 08/04/2013 14:45  Initial Date:    Blindness, one eye, low vision other eye   ICD9: 369.10  Onset: 04/05/2013 11:12  Initial Date:     Parkinson's disease NOS   ICD10:   ICD9: 332.0  Onset: 08/04/2013 14:46    Initial Date:    Benign hypertension   ICD9: 401.1  Onset: 04/05/2013 10:16  Initial Date:  SURGICAL TREATMENT PLAN: phaco emulsion cataract extraction  w  intraocular lens implant  and glaucoma surgery  trabeculectomy   OS    WITH EXPRESS MINI-TUBE  Risk and benefits of Cataract and Glaucoma surgery have been reviewed with the patient, suggested that  patient  goes with glaucoma surgery if gtts and pills doesn't work.       ___________________________ Fredy Gladu, Jr. Starter - Inactive Problems:  

## 2013-09-02 ENCOUNTER — Encounter (HOSPITAL_COMMUNITY): Payer: Self-pay | Admitting: Ophthalmology

## 2014-05-13 NOTE — Consult Note (Signed)
Chief Complaint:  Subjective/Chief Complaint Patient with a large amount of rectal bleeding with nausea and shoulder pain. The patient is planned to have a CT scan of the abd today.   Brief Assessment:  Respiratory normal resp effort   Additional Physical Exam NADA&O x 3   Lab Results: Routine Chem:  11-May-14 04:31   Glucose, Serum 86  BUN 15  Creatinine (comp) 0.93  Sodium, Serum 140  Potassium, Serum 3.7  Chloride, Serum  108  CO2, Serum 29  Calcium (Total), Serum 9.4  Anion Gap  3  Osmolality (calc) 280  eGFR (African American) >60  eGFR (Non-African American)  58 (eGFR values <27m/min/1.73 m2 may be an indication of chronic kidney disease (CKD). Calculated eGFR is useful in patients with stable renal function. The eGFR calculation will not be reliable in acutely ill patients when serum creatinine is changing rapidly. It is not useful in  patients on dialysis. The eGFR calculation may not be applicable to patients at the low and high extremes of body sizes, pregnant women, and vegetarians.)  Routine Hem:  11-May-14 04:31   WBC (CBC) 5.1  RBC (CBC) 3.97  Hemoglobin (CBC) 12.6  Hematocrit (CBC) 37.7  Platelet Count (CBC)  144  MCV 95  MCH 31.6  MCHC 33.3  RDW  15.5  Neutrophil % 46.2  Lymphocyte % 40.4  Monocyte % 9.6  Eosinophil % 3.1  Basophil % 0.7  Neutrophil # 2.4  Lymphocyte # 2.1  Monocyte # 0.5  Eosinophil # 0.2  Basophil # 0.0 (Result(s) reported on 31 May 2012 at 05:24AM.)   Assessment/Plan:  Assessment/Plan:  Assessment Lower Gi bleed with nausea.   Plan Follow Hb. Prep for colonoscopy for tomorrow.   Electronic Signatures: WLucilla Lame(MD)  (Signed 11-May-14 12:19)  Authored: Chief Complaint, Brief Assessment, Lab Results, Assessment/Plan   Last Updated: 11-May-14 12:19 by WLucilla Lame(MD)

## 2014-05-13 NOTE — H&P (Signed)
PATIENT NAME:  Lydia Church, Lydia M MR#:  [id number] DATE OF BIRTH:  1930/11/22  DATE OF ADMISSION:  05/30/2012  PRIMARY CARE PHYSICIAN:  Dr. Cherly HensenBernadette Page.  REFERRING PHYSICIAN:  Maricela BoLuna Ragsdale, MD  CHIEF COMPLAINT:  GI bleed.  HISTORY OF PRESENT ILLNESS:  The patient is a 81 year old African-American female with a past medical history of hyperlipidemia, hypertension and chronic glaucoma who is presenting to the ER with a chief complaint of a 1-day history of lower GI bleed.  It started at around 8 to 9 a.m. yesterday and so far she has experienced 3 to 4 episodes of bouts of some fresh blood as well as some dark blood.  She denies any abdominal pain.  No nausea or vomiting.  She had had a similar problem in the past in the ER in 2011.  At that time, the patient had esophagogastroduodenoscopy and colonoscopy done.  Colonoscopy had revealed diverticulosis and internal hemorrhoids.  At the time of my examination, the patient is comfortable and denies any complaints.    PAST MEDICAL HISTORY:  Hypertension, hyperlipidemia and history of glaucoma.    PAST SURGICAL HISTORY: Hysterectomy.  ALLERGIES:  The patient has no known drug allergies.  HOME MEDICATIONS: MiraLAX 17 grams once daily,  lovastatin 20 mg once daily, Cozaar 25 mg once daily, fexofenadine 60 mg b.i.d.  PSYCHOSOCIAL HISTORY:  Lives at home.  FAMILY HISTORY:  Mother had a history of hypertension.  REVIEW OF SYSTEMS:   CONSTITUTIONAL: Denies any fever, fatigue, weakness. No pain, no weight loss or weight gain.  EYES: No blurry vision, history of glaucoma. EARS, NOSE, THROAT: Denies tinnitus, epistaxis, discharge, difficulty swallowing. RESPIRATORY:  Denies any cough, painful respirations or COPD. CARDIOVASCULAR: No chest pain, palpitations or syncope.  GASTROINTESTINAL: Denies any nausea, vomiting, but complaining of loose bowel movements with fresh and somewhat dark colored blood as well.  Denies any jaundice or  constipation. GENITOURINARY: No dysuria, hematuria.  No breast masses or vaginal discharge. ENDOCRINE:  No polyuria, nocturia, thyroid problems or increased sweating.   HEMATOLOGIC/LYMPHATIC:  No anemia or easy bruising.  Positive rectal bleed. INTEGUMENTARY: No acne, rash, lesions.  MUSCULOSKELETAL: No joint pain in the neck, back, shoulder. Denies any gout.  NEUROLOGIC: No vertigo, ataxia, CVA, TIA.  PSYCHIATRIC:  Denies any ADD, OCD, bipolar disorder.  The patient denies any schizophrenia or nervousness.  PHYSICAL EXAMINATION: VITAL SIGNS: Temperature 979, pulse 64, respirations 16, blood pressure 143/71, pulse ox 96%. GENERAL:  Not in acute distress. Moderately built and moderately nourished.  HEENT: Normocephalic, atraumatic. Pupils are equally reacting to light and accommodation. No scleral icterus. No conjunctival injection. Extraocular movements are intact.  Positive cataracts. Positive pterygium.  No sinus tenderness.  No pharyngeal exudates. No postnasal drip.  NECK: Supple. No JVD. No thyromegaly. No lymphadenopathy.  LUNGS: Clear to auscultation bilaterally.  No accessory muscle usage. No anterior chest wall tenderness on palpation.  CARDIOVASCULAR: S1 and S2 normal.  Regular rate and rhythm, no murmurs. GASTROINTESTINAL: Soft. Bowel sounds are positive in all 4 quadrants. Nontender, nondistended. No masses felt. No hepatosplenomegaly. NEUROLOGIC:  Awake, alert and oriented x 3. Motor and sensory are grossly intact. Cranial nerves II through XII are intact.  Reflexes are 2+.  EXTREMITIES: No edema. No cyanosis. No clubbing.  SKIN:  No rashes, no lesions, warm to touch. MUSCULOSKELETAL: No joint effusion, tenderness or erythema. Pulses are 2+. PSYCHIATRIC:  Normal mood and affect.  The patient is at her baseline.  LABORATORIES AND IMAGING STUDIES:  CT scan of  the head headache revealed no acute findings.  Glucose 122, BUN 23, creatinine 1.01, sodium 141, potassium 4.8, chloride 110,  CO2 of 27, GFR greater than 60, anion gap 4.  Serum osmolality 286, serum calcium 10.0.  LFTs are within normal range. WBC 5.8, hemoglobin 14.1, hematocrit 42.5, platelets 160,000.  Urinalysis clear in color, clear in appearance, bilirubin 3.72, specific gravity 1.023, pH 5.0, no blood in the urine sample, mucus present. CT of the head is negative.  Intraocular pressure was measured by the ER physician as the patient has glaucoma which is 36.5 on average.    ASSESSMENT and PLAN:     1.  The patient is a 81 year old female who comes into the ER with a chief complaint of 4 episodes of rectal bleed with no other symptoms.   Lower gastrointestinal bleed probably from internal hemorrhoids versus diverticulosis.  The patient denies any abdominal pain.  We will give her clear liquids and the patient will be on IV fluids and proton pump inhibitor.  Monitor hemoglobin and hematocrit every 6 hours.  GI consultation is placed.   2.  Hypertension and hyperlipidemia.  Blood pressure is stable and cholesterol is stable.  Continue current medications. 3.  Glaucoma, chronic in nature.   4.  The patient is a DNR.  Total time spent on admission:  50 minutes.  The diagnosis and plan of care was discussed with the patient and her son at bedside.  They both indicated understanding of the plan.  This was all discussed with the GI oncology gastroenterologist who has recommended a clear liquid diet.      ____________________________ Ramonita Lab, MD ag:ct D: 05/30/2012 01:07:04 ET T: 05/30/2012 08:52:35 ET JOB#: [id number]  cc: Ramonita Lab, MD, <Dictator> Outside Physician Dr. Hal Hope MD ELECTRONICALLY SIGNED 06/08/2012 22:43

## 2014-05-13 NOTE — Consult Note (Signed)
Brief Consult Note: Diagnosis: Lower Gi Bleed. The patient had a colonoscopy in the past (2 years ago) with diverticulosis. She had bleeding that has stopped.   Patient was seen by consultant.   Consult note dictated.   Comments: The patient has had no further bleeding over night. She will have her diet advanced. No need to repeat colonoscopy at this time unless rebleeds. Will follow up as out patient.  Electronic Signatures: Midge MiniumWohl, Lehi Phifer (MD)  (Signed [phone]-May-14 09:23)  Authored: Brief Consult Note   Last Updated: 10-May-14 09:23 by Midge MiniumWohl, Urijah Raynor (MD)

## 2014-05-13 NOTE — Consult Note (Signed)
PATIENT NAME:  Lydia Church, Chandrea M MR#:  [id number] DATE OF BIRTH:  April 21, 1930  DATE OF CONSULTATION:  05/30/2012  CONSULTING SERVICE:  Gastroenterology   CONSULTING PHYSICIAN:  Midge Miniumarren Sebastion Jun, MD  REASON FOR CONSULTATION: Lower GI  bleed.   HISTORY OF PRESENT ILLNESS: This patient is a 81 year old woman who has a history of a lower GI bleed in the past with the finding of diverticulosis in 2011. The patient also was found to have internal hemorrhoids. She comes with 1-day history of lower GI bleeding with bright red blood per rectum. She denies any abdominal pain, nausea, vomiting, fevers, chills or black stools. She also states that she was fine up until this time without any abdominal pain. The patient has had no further bleeding since being admitted and states that there was no bleeding from the time she was seen in 2011 until this recent episode of bleeding.   PAST MEDICAL HISTORY: Hypertension, hyperlipidemia, glaucoma.   PAST SURGICAL HISTORY: Hysterectomy.   ALLERGIES:  No known drug allergies.  HOME MEDICATIONS: MiraLax, lovastatin, Cozaar, fexofenadine.  FAMILY HISTORY: Noncontributory.   REVIEW OF SYSTEMS: A 10-point review of systems was negative except for what was stated above.   PHYSICAL EXAMINATION:  GENERAL:  The patient is lying in bed in no apparent distress. Family around, alert and oriented x 3. VITAL SIGNS: Temperature 97.8, blood pressure 162/88, pulse oximetry 97% on room air, pulse 59.  HEENT:  Normocephalic, atraumatic. Extraocular motor intact.  Pupils are equally round and reactive to light and accommodation without JVD, without lymphadenopathy.  LUNGS: Clear to auscultation bilaterally.  HEART: Regular rate and rhythm without murmurs, rubs, or gallops.  ABDOMEN: Soft, nontender, nondistended without hepatosplenomegaly. Extremities without cyanosis, clubbing or edema.  NEUROLOGICAL: Exam grossly intact.  SKIN: Without any rashes or lesions.  PSYCHIATRIC: Normal mood  and affect.   LABORATORY AND RADIOLOGICAL FINDINGS:  CT scan of the head with no abnormal findings or acute findings.  Hemoglobin 14.1 on admission.  A repeat showed it to be 13.5. The patient's BUN was slightly high at 20.   ASSESSMENT AND PLAN: This patient is a 81 year old woman who had a work-up for GI bleeding in 2011 that showed diverticulosis and internal hemorrhoids. The patient now has rectal bleeding that has since stopped. Her hemoglobin has remained stable. The patient will have her diet advanced. There is no need to repeat her procedures at this time with her history of diverticulosis and internal hemorrhoids, and she is no longer having any rectal bleeding. The patient should follow up with me as an outpatient if no further bleeding.   Thank you very much for involving me in the care of this patient. If you have any questions, please do not hesitate to call.  ____________________________ Midge Miniumarren Keith Felten, MD dw:cb D: 05/30/2012 21:53:36 ET T: 05/31/2012 15:26:14 ET JOB#: [id number]  cc: Midge Miniumarren Kirrah Mustin, MD, <Dictator> Midge MiniumARREN Ravneet Spilker MD ELECTRONICALLY SIGNED 05/31/2012 17:21

## 2014-05-13 NOTE — Discharge Summary (Signed)
PATIENT NAME:  Lydia Church, Lydia Church MR#:  [id number] DATE OF BIRTH:  January 19, 1931  DATE OF ADMISSION:  05/30/2012 DATE OF DISCHARGE:  06/01/2012  DISCHARGE DIAGNOSES: 1.  Rectal bleed, likely diverticular versus hemorrhoidal in nature, now hemodynamically stable, no more bleeding.  2.  Relative hypotension with history of hypertension, now resolved with intravenous fluids.   SECONDARY DIAGNOSES: 1.  Hypertension.  2.  Hyperlipidemia.  3.  Glaucoma.   CONSULTATIONS:  GI, Dr. Midge Miniumarren Wohl.   PROCEDURES AND RADIOLOGY:  Colonoscopy on 05/12 by Dr. Midge Miniumarren Wohl showed multiple diverticula in the sigmoid colon, no evidence of diverticular bleeding, internal hemorrhoids present.   CT scan of the abdomen and pelvis with contrast on 05/11 showed pulmonary edema, no evidence of obstructive or inflammatory abnormalities within the abdomen.   Right shoulder x-ray on 05/10 showed no acute abnormalities.   CT scan of the head without contrast on 05/09 showed no acute intracranial abnormalities.   MAJOR LABORATORY PANEL:  Urinalysis on admission was negative.   HISTORY AND SHORT HOSPITAL COURSE:  The patient is a 81 year old female with the above-mentioned problems who was admitted for lower GI bleed/rectal bleed.  Please see Dr. Rob HickmanGouru's dictated history and physical for further details. GI consultation was obtained with Dr. Midge Miniumarren Wohl who recommended colonoscopy if the patient rebleeds which she did 24 hours after arrival and colonoscopy was performed on  05/12 which showed some internal hemorrhoids and diverticulosis.  No diverticular bleeding was seen. The patient did not have any subsequent bleeding after initial episode on admission and she remained hemodynamically stable and vitals were also stable. She was tolerating diet and was discharged on 05/12 in stable condition.   PHYSICAL EXAMINATION: VITAL SIGNS: On the date of discharge her vital signs are as follows: Temperature 98, heart rate 66 per minute,  respirations 18 per minute, blood pressure 107-64 mmHg.  She was saturating 95% on room air.   Pertinent Physical Examination on the date of discharge:   CARDIOVASCULAR: S1, S2 normal. No murmurs, rubs or gallop.  LUNGS: Clear to auscultation bilaterally. No wheezing, rales, rhonchi, or crepitation.  ABDOMEN: Soft, benign.  NEUROLOGIC: Nonfocal examination.  All other physical examination remained at the baseline.   DISCHARGE MEDICATIONS: 1.  Lasix 20 mg p.o. daily. 2.  Cozaar 50 mg p.o. daily.  3.  Pravastatin 40 mg p.o. at bedtime.  4.  Sertraline 10 mg p.o. daily.  5.  Flonase spray in each nostril once daily once daily.  6.  Vitamin D3, vitamin D3 at 1000 international units once daily.  7.  Phazyme 125 mg p.o. 1 capsule 4 times a day as needed.  8.  Methazolamide 50 mg p.o. b.i.d.  9.  Lumigan 0.03% ophthalmic solution one drop to each eye once a day.  10.  Cosopt one drop to each eye twice a day. 11.  Aspirin 81 mg p.o. daily. 12.  Maalox Plus 2 tablets p.o. daily as needed.  13.  Proventil 2 puffs inhaled 4 times a day. 14.  Anusol-HC 2.5% cream one application per rectum twice a day.  15.  Drinks MiraLAX once daily.   DISCHARGE DIET: Low sodium, low fat, low cholesterol.  Eat light for the first meal. Low residue and low fiber diet.  DISCHARGE ACTIVITY: As tolerated.   DISCHARGE INSTRUCTIONS AND FOLLOW UP:  The patient was instructed to follow up with her primary care physician, Dr. Betsey AmenPaige Bernadette in 1 to 2 weeks and she will need follow-up with Dr. Ebony Hailarren  Wohl from GI in 2 to 4 weeks.   TOTAL TIME DISCHARGING THIS PATIENT: 55 minutes.    ____________________________ Ellamae Sia. Sherryll Burger, MD vss:ct D: 06/05/2012 23:02:25 ET T: 06/06/2012 07:30:06 ET JOB#: [id number]  cc: Zaira Iacovelli S. Sherryll Burger, MD, <Dictator> Dr. Romeo Rabon, MD Ellamae Sia [facility] MD ELECTRONICALLY SIGNED 06/08/2012 8:55

## 2014-05-13 NOTE — H&P (Signed)
PATIENT NAME:  Lydia Church, Lydia Church MR#:  [id number] DATE OF BIRTH:  1931/01/01  DATE OF ADMISSION:  05/30/2012  NO DICTATION:  ____________________________ Ramonita LabAruna Jalynne Persico, MD ag:ct D: 05/30/2012 01:07:04 ET T: 05/30/2012 10:46:50 ET JOB#: [id number]  cc: Ramonita LabAruna Tyrisha Benninger, MD, <Dictator> Ramonita LabARUNA Janah Mcculloh MD ELECTRONICALLY SIGNED 06/20/2012 6:37

## 2015-01-31 ENCOUNTER — Emergency Department: Payer: Medicare Other

## 2015-01-31 ENCOUNTER — Inpatient Hospital Stay
Admission: EM | Admit: 2015-01-31 | Discharge: 2015-02-01 | DRG: 310 | Disposition: A | Discharge status: Home / Self Care | Payer: Medicare Other | Attending: Internal Medicine | Admitting: Internal Medicine

## 2015-01-31 ENCOUNTER — Encounter: Payer: Self-pay | Admitting: *Deleted

## 2015-01-31 DIAGNOSIS — R55 Syncope and collapse: Secondary | ICD-10-CM

## 2015-01-31 DIAGNOSIS — H409 Unspecified glaucoma: Secondary | ICD-10-CM | POA: Diagnosis present

## 2015-01-31 DIAGNOSIS — H5441 Blindness, right eye, normal vision left eye: Secondary | ICD-10-CM | POA: Diagnosis present

## 2015-01-31 DIAGNOSIS — K219 Gastro-esophageal reflux disease without esophagitis: Secondary | ICD-10-CM | POA: Diagnosis present

## 2015-01-31 DIAGNOSIS — M1990 Unspecified osteoarthritis, unspecified site: Secondary | ICD-10-CM | POA: Diagnosis present

## 2015-01-31 DIAGNOSIS — I10 Essential (primary) hypertension: Secondary | ICD-10-CM | POA: Diagnosis present

## 2015-01-31 DIAGNOSIS — W19XXXA Unspecified fall, initial encounter: Secondary | ICD-10-CM | POA: Diagnosis present

## 2015-01-31 DIAGNOSIS — Z90710 Acquired absence of both cervix and uterus: Secondary | ICD-10-CM | POA: Diagnosis not present

## 2015-01-31 DIAGNOSIS — G20 Parkinson's disease: Secondary | ICD-10-CM | POA: Diagnosis present

## 2015-01-31 DIAGNOSIS — G20A1 Parkinson's disease without dyskinesia, without mention of fluctuations: Secondary | ICD-10-CM | POA: Diagnosis present

## 2015-01-31 DIAGNOSIS — R531 Weakness: Secondary | ICD-10-CM | POA: Diagnosis present

## 2015-01-31 DIAGNOSIS — I480 Paroxysmal atrial fibrillation: Principal | ICD-10-CM | POA: Diagnosis present

## 2015-01-31 DIAGNOSIS — M79601 Pain in right arm: Secondary | ICD-10-CM | POA: Diagnosis present

## 2015-01-31 DIAGNOSIS — Z9842 Cataract extraction status, left eye: Secondary | ICD-10-CM

## 2015-01-31 DIAGNOSIS — R778 Other specified abnormalities of plasma proteins: Secondary | ICD-10-CM | POA: Diagnosis present

## 2015-01-31 DIAGNOSIS — Z66 Do not resuscitate: Secondary | ICD-10-CM | POA: Diagnosis present

## 2015-01-31 DIAGNOSIS — F329 Major depressive disorder, single episode, unspecified: Secondary | ICD-10-CM | POA: Diagnosis present

## 2015-01-31 DIAGNOSIS — Z8249 Family history of ischemic heart disease and other diseases of the circulatory system: Secondary | ICD-10-CM

## 2015-01-31 DIAGNOSIS — R7989 Other specified abnormal findings of blood chemistry: Secondary | ICD-10-CM | POA: Diagnosis present

## 2015-01-31 LAB — COMPREHENSIVE METABOLIC PANEL
ALBUMIN: 3.9 g/dL (ref 3.5–5.0)
ALT: 7 U/L — ABNORMAL LOW (ref 14–54)
AST: 30 U/L (ref 15–41)
Alkaline Phosphatase: 52 U/L (ref 38–126)
Anion gap: 6 (ref 5–15)
BUN: 21 mg/dL — ABNORMAL HIGH (ref 6–20)
CHLORIDE: 109 mmol/L (ref 101–111)
CO2: 25 mmol/L (ref 22–32)
CREATININE: 1.04 mg/dL — AB (ref 0.44–1.00)
Calcium: 10 mg/dL (ref 8.9–10.3)
GFR calc Af Amer: 56 mL/min — ABNORMAL LOW (ref >60)
GFR calc non Af Amer: 49 mL/min — ABNORMAL LOW (ref >60)
GLUCOSE: 121 mg/dL — AB (ref 65–99)
POTASSIUM: 3.7 mmol/L (ref 3.5–5.1)
Sodium: 140 mmol/L (ref 135–145)
Total Bilirubin: 0.6 mg/dL (ref 0.3–1.2)
Total Protein: 6.7 g/dL (ref 6.5–8.1)

## 2015-01-31 LAB — URINALYSIS COMPLETE WITH MICROSCOPIC (ARMC ONLY)
BACTERIA UA: NONE SEEN
Bilirubin Urine: NEGATIVE
GLUCOSE, UA: NEGATIVE mg/dL
NITRITE: NEGATIVE
PH: 5 (ref 5.0–8.0)
Protein, ur: NEGATIVE mg/dL
SPECIFIC GRAVITY, URINE: 1.02 (ref 1.005–1.030)

## 2015-01-31 LAB — CBC
HCT: 44 % (ref 35.0–47.0)
Hemoglobin: 14.1 g/dL (ref 12.0–16.0)
MCH: 30.2 pg (ref 26.0–34.0)
MCHC: 32.2 g/dL (ref 32.0–36.0)
MCV: 94 fL (ref 80.0–100.0)
PLATELETS: 155 10*3/uL (ref 150–440)
RBC: 4.68 MIL/uL (ref 3.80–5.20)
RDW: 15.4 % — AB (ref 11.5–14.5)
WBC: 8.7 10*3/uL (ref 3.6–11.0)

## 2015-01-31 LAB — PROTIME-INR
INR: 1.12
PROTHROMBIN TIME: 14.6 s (ref 11.4–15.0)

## 2015-01-31 LAB — APTT: APTT: 33 s (ref 24–36)

## 2015-01-31 LAB — TROPONIN I: Troponin I: 0.35 ng/mL — ABNORMAL HIGH (ref <0.031)

## 2015-01-31 MED ORDER — SODIUM CHLORIDE 0.9 % IJ SOLN
3.0000 mL | Freq: Two times a day (BID) | INTRAMUSCULAR | Status: DC
  Administered 2015-01-31: 3 mL via INTRAVENOUS

## 2015-01-31 MED ORDER — ACETAMINOPHEN 650 MG RE SUPP: 650.0000 mg | Freq: Four times a day (QID) | RECTAL | Status: DC | PRN

## 2015-01-31 MED ORDER — NITROGLYCERIN 0.4 MG SL SUBL
0.4000 mg | SUBLINGUAL_TABLET | Freq: Once | SUBLINGUAL | Status: AC
  Administered 2015-01-31: 0.4 mg via SUBLINGUAL
  Filled 2015-01-31: qty 1

## 2015-01-31 MED ORDER — SODIUM CHLORIDE 0.9 % IV SOLN
INTRAVENOUS | Status: DC
  Administered 2015-01-31: 23:00:00 via INTRAVENOUS

## 2015-01-31 MED ORDER — BRIMONIDINE TARTRATE 0.2 % OP SOLN
1.0000 [drp] | Freq: Three times a day (TID) | OPHTHALMIC | Status: DC
  Administered 2015-01-31 – 2015-02-01 (×2): 1 [drp] via OPHTHALMIC
  Filled 2015-01-31: qty 5

## 2015-01-31 MED ORDER — ONDANSETRON HCL 4 MG PO TABS: 4.0000 mg | ORAL_TABLET | Freq: Four times a day (QID) | ORAL | Status: DC | PRN

## 2015-01-31 MED ORDER — ACETAMINOPHEN 325 MG PO TABS: 650.0000 mg | ORAL_TABLET | Freq: Four times a day (QID) | ORAL | Status: DC | PRN

## 2015-01-31 MED ORDER — NON FORMULARY: 1.0000 [drp] | Freq: Three times a day (TID) | Status: DC

## 2015-01-31 MED ORDER — ONDANSETRON HCL 4 MG/2ML IJ SOLN: 4.0000 mg | Freq: Four times a day (QID) | INTRAMUSCULAR | Status: DC | PRN

## 2015-01-31 MED ORDER — HEPARIN (PORCINE) IN NACL 100-0.45 UNIT/ML-% IJ SOLN
850.0000 [IU]/h | INTRAMUSCULAR | Status: DC
  Administered 2015-01-31: 850 [IU]/h via INTRAVENOUS
  Filled 2015-01-31 (×2): qty 250

## 2015-01-31 MED ORDER — BRINZOLAMIDE 1 % OP SUSP
1.0000 [drp] | Freq: Three times a day (TID) | OPHTHALMIC | Status: DC
  Filled 2015-01-31: qty 10

## 2015-01-31 MED ORDER — HEPARIN BOLUS VIA INFUSION
4000.0000 [IU] | Freq: Once | INTRAVENOUS | Status: AC
  Administered 2015-01-31: 4000 [IU] via INTRAVENOUS
  Filled 2015-01-31: qty 4000

## 2015-01-31 MED ORDER — LOSARTAN POTASSIUM 50 MG PO TABS
50.0000 mg | ORAL_TABLET | Freq: Every day | ORAL | Status: DC
  Administered 2015-01-31 – 2015-02-01 (×2): 50 mg via ORAL
  Filled 2015-01-31 (×2): qty 1

## 2015-01-31 MED ORDER — ACETAMINOPHEN 325 MG PO TABS
650.0000 mg | ORAL_TABLET | Freq: Once | ORAL | Status: AC
  Administered 2015-01-31: 650 mg via ORAL
  Filled 2015-01-31: qty 2

## 2015-01-31 NOTE — Progress Notes (Addendum)
ANTICOAGULATION CONSULT NOTE - Initial Consult  Pharmacy Consult for Heparin  Indication: chest pain/ACS  No Known Allergies  Patient Measurements: Height: 5\' 4"  (162.6 cm) Weight: 165 lb (74.844 kg) IBW/kg (Calculated) : 54.7 Heparin Dosing Weight: 70.3 kg   Vital Signs: Temp: 98.5 F (36.9 C) (01/10 1745) Temp Source: Oral (01/10 1745) BP: 144/95 mmHg (01/10 1851) Pulse Rate: 88 (01/10 1851)  Labs:  Recent Labs  01/31/15 1759  HGB 14.1  HCT 44.0  PLT 155  CREATININE 1.04*  TROPONINI 0.35*    Estimated Creatinine Clearance: 41.3 mL/min (by C-G formula based on Cr of 1.04).   Medical History: Past Medical History  Diagnosis Date  . Hypertension   . Glaucoma   . Parkinson's disease (HCC)   . Depression     never been treated for depression, has felt that way since Parkinson's   . GERD (gastroesophageal reflux disease)     uses gas ex as needed  . Headache(784.0)     because sinus problems  . Arthritis   . Anemia   . Cataract   . Legally blind in right eye, as [place]     Medications:   (Not in a hospital admission)  Assessment: Pharmacy consulted to dose heparin in this 82 year old female admitted with ACS.  CrCl = 41.3 ml/min No prior anticoag noted.  Goal of Therapy:  Heparin level 0.3-0.7 units/ml Monitor platelets by anticoagulation protocol: Yes   Plan:  Will order Heparin 4000 units IV X 1 bolus followed by heparin gtt to start at 850 units/hr. Will order baseline INR. Will draw 1st HL 8 hrs after start of drip on 1/11 @ 0500.  Will check CBC daily.   Reef Achterberg D 01/31/2015,8:06 PM

## 2015-01-31 NOTE — ED Provider Notes (Signed)
[facility] Emergency Department Provider Note  ____________________________________________  Time seen: 6:40 PM  I have reviewed the triage vital signs and the nursing notes.   HISTORY  Chief Complaint Fall and Dizziness    HPI Lydia Church is a 82 y.o. female brought to the ED due to dizziness and lightheadedness with generalized weakness today causing her to fall in her bathroom at about 9 AM. Because she felt so weak she was unable to get up and lay on the floor until 2 or 3 PM. At that point she was found and brought to the emergency room. She complains of bilateral knee pain from the fall as well as pain to the forehead where her head hit the wall and pain in the neck. Denies loss of consciousness. Denies any chest pain shortness of breath back pain or abdominal pain. No vision changes or focal weakness or paresthesia.     Past Medical History  Diagnosis Date  . Hypertension   . Glaucoma   . Parkinson's disease (HCC)   . Depression     never been treated for depression, has felt that way since Parkinson's   . GERD (gastroesophageal reflux disease)     uses gas ex as needed  . Headache(784.0)     because sinus problems  . Arthritis   . Anemia   . Cataract   . Legally blind in right eye, as defined in [place]      There are no active problems to display for this patient.    Past Surgical History  Procedure Laterality Date  . Eye surgery      fluid removed from eyes  . Abdominal hysterectomy    . Colonoscopy    . Trabeculectomy Left 09/01/2013    Procedure: TRABECULECTOMY LEFT EYE;  Surgeon: Chalmers Guest, MD;  Location: [facility] OR;  Service: Ophthalmology;  Laterality: Left;  Marland Kitchen Mitomycin c application Left 09/01/2013    Procedure: MITOMYCIN C APPLICATION;  Surgeon: Chalmers Guest, MD;  Location: [facility] OR;  Service: Ophthalmology;  Laterality: Left;  . Cataract extraction w/phaco Left 09/01/2013    Procedure: CATARACT EXTRACTION PHACO AND INTRAOCULAR  LENS PLACEMENT (IOC);  Surgeon: Chalmers Guest, MD;  Location: [facility] OR;  Service: Ophthalmology;  Laterality: Left;     Current Outpatient Rx  Name  Route  Sig  Dispense  Refill  . bimatoprost (LUMIGAN) 0.01 % SOLN   Left Eye   Place 1 drop into the left eye at bedtime.         . brimonidine-timolol (COMBIGAN) 0.2-0.5 % ophthalmic solution   Left Eye   Place 1 drop into the left eye 2 (two) times daily.         . carbidopa-levodopa (SINEMET IR) 25-100 MG per tablet   Oral   Take 1 tablet by mouth 3 (three) times daily after meals.         . cetirizine (ZYRTEC) 10 MG tablet   Oral   Take 10 mg by mouth daily as needed (congestion).         . fluticasone (FLONASE) 50 MCG/ACT nasal spray   Each Nare   Place 2 sprays into both nostrils daily as needed for allergies or rhinitis.         . furosemide (LASIX) 20 MG tablet   Oral   Take 20 mg by mouth 2 (two) times a week.         . losartan (COZAAR) 50 MG tablet   Oral  Take 50 mg by mouth daily.         . methazolamide (NEPTAZANE) 50 MG tablet   Oral   Take 50 mg by mouth 2 (two) times daily.            Allergies Review of patient's allergies indicates no known allergies.   Family History  Problem Relation Age of Onset  . Hypertension Mother     Social History Social History  Substance Use Topics  . Smoking status: Never Smoker   . Smokeless tobacco: Never Used  . Alcohol Use: No    Review of Systems  Constitutional:   No fever or chills. No weight changes Eyes:   No blurry vision or double vision.  ENT:   No sore throat. Cardiovascular:   No chest pain. Respiratory:   No dyspnea or cough. Gastrointestinal:   Negative for abdominal pain, vomiting and diarrhea.  No BRBPR or melena. Genitourinary:   Negative for dysuria, urinary retention, bloody urine, or difficulty urinating. Positive urinary frequency for 2 weeks Musculoskeletal:   Negative for back pain. No joint swelling or pain. Skin:    Negative for rash. Neurological:   Negative for headaches, focal weakness or numbness. Positive dizziness and near syncope today Psychiatric:  No anxiety or depression.   Endocrine:  No hot/cold intolerance, changes in energy, or sleep difficulty.  10-point ROS otherwise negative.  ____________________________________________   PHYSICAL EXAM:  VITAL SIGNS: ED Triage Vitals  Enc Vitals Group     BP 01/31/15 1745 133/79 mmHg     Pulse Rate 01/31/15 1745 86     Resp 01/31/15 1745 20     Temp 01/31/15 1745 98.5 F (36.9 C)     Temp Source 01/31/15 1745 Oral     SpO2 01/31/15 1745 99 %     Weight 01/31/15 1745 165 lb (74.844 kg)     Height 01/31/15 1745 5\' 4"  (1.626 m)     Head Cir --      Peak Flow --      Pain Score 01/31/15 1748 8     Pain Loc --      Pain Edu? --      Excl. in GC? --     Vital signs reviewed, nursing assessments reviewed.   Constitutional:   Alert and oriented. Well appearing and in no distress. Eyes:   No scleral icterus. No conjunctival pallor. Left eye red, right eye with a conjunctival cyst. She is under the care of ophthalmology for glaucoma. Reports that the redness of her left eye is chronic. ENT   Head:   Normocephalic with a mid forehead contusion. No hematoma or laceration. No bony point tenderness or stability..   Nose:   No congestion/rhinnorhea. No septal hematoma   Mouth/Throat:   MMM, no pharyngeal erythema. No peritonsillar mass. No uvula shift.   Neck:   No stridor. No SubQ emphysema. No meningismus. Hematological/Lymphatic/Immunilogical:   No cervical lymphadenopathy. Cardiovascular:   RRR. Normal and symmetric distal pulses are present in all extremities. Positive systolic murmur  Respiratory:   Normal respiratory effort without tachypnea nor retractions. Breath sounds are clear and equal bilaterally. No wheezes/rales/rhonchi. Gastrointestinal:   Soft and nontender. No distention. There is no CVA tenderness.  No rebound,  rigidity, or guarding. Genitourinary:   deferred Musculoskeletal:   Abrasions over bilateral patella anteriorly. No focal bony point tenderness. No pain with range of motion of the knee or hips. Knees are stable. Mild tenderness over the right humerus without  any instability or crepitus. Intact range of motion. Neurologic:   Normal speech and language.  CN 2-10 normal. Motor grossly intact. No gross focal neurologic deficits are appreciated.  Skin:    Skin is warm, dry and intact. No rash noted.  No petechiae, purpura, or bullae. Psychiatric:   Mood and affect are normal. Speech and behavior are normal. Patient exhibits appropriate insight and judgment.  ____________________________________________    LABS (pertinent positives/negatives) (all labs ordered are listed, but only abnormal results are displayed) Labs Reviewed  CBC - Abnormal; Notable for the following:    RDW 15.4 (*)    All other components within normal limits  COMPREHENSIVE METABOLIC PANEL - Abnormal; Notable for the following:    Glucose, Bld 121 (*)    BUN 21 (*)    Creatinine, Ser 1.04 (*)    ALT 7 (*)    GFR calc non Af Amer 49 (*)    GFR calc Af Amer 56 (*)    All other components within normal limits  TROPONIN I - Abnormal; Notable for the following:    Troponin I 0.35 (*)    All other components within normal limits  URINALYSIS COMPLETEWITH MICROSCOPIC ([facility] ONLY)   ____________________________________________   EKG  Interpreted by me Sinus rhythm rate of 88, left axis, normal intervals. Normal QRS and ST segments and T waves. 4 premature atrial contractions on the strip  ____________________________________________    RADIOLOGY  CT head and cervical spine unremarkable X-ray right humerus unremarkable X-ray bilateral knee unremarkable  ____________________________________________   PROCEDURES CRITICAL CARE Performed by: Scotty CourtSTAFFORD, Jamariyah Johannsen   Total critical care time: 35  minutes  Critical care time was exclusive of separately billable procedures and treating other patients.  Critical care was necessary to treat or prevent imminent or life-threatening deterioration.  Critical care was time spent personally by me on the following activities: development of treatment plan with patient and/or surrogate as well as nursing, discussions with consultants, evaluation of patient's response to treatment, examination of patient, obtaining history from patient or surrogate, ordering and performing treatments and interventions, ordering and review of laboratory studies, ordering and review of radiographic studies, pulse oximetry and re-evaluation of patient's condition.   ____________________________________________   INITIAL IMPRESSION / ASSESSMENT AND PLAN / ED COURSE  Pertinent labs & imaging results that were available during my care of the patient were reviewed by me and considered in my medical decision making (see chart for details).  Patient presents with a fall due to weakness and presyncope at home. We'll evaluate with labs urinalysis and imaging for possible trauma   ----------------------------------------- 8:03 PM on 01/31/2015 -----------------------------------------   No symptoms to pinpoint a focal pathologic process, but screening workup including radiology and labs reveals an elevated troponin. I do not have any prior results to compare to in the electronic medical record. EKG does not reveal any obvious STEMI or NSTEMI.  Due to underlying impaired renal function, we'll start the patient on IV heparin and plan for hospitalization for further evaluation and management. The patient does report right arm pain that started just before she got weak and fell down this morning which could be an anginal equivalent. We'll give a trial of nitroglycerin to attempt symptomatic relief of this right arm pain which is continuing. Patient and family informed of results.  We will discuss with hospitalist for admission.      ____________________________________________   FINAL CLINICAL IMPRESSION(S) / ED DIAGNOSES  Final diagnoses:  Near syncope  Elevated troponin  I level      Sharman Cheek, MD 01/31/15 2003

## 2015-01-31 NOTE — ED Notes (Signed)
Per dr Alphonzo Lemmingsmcshane, order ct scan, xrays and labs.

## 2015-01-31 NOTE — ED Notes (Addendum)
Pt assisted to bed pan at this time, tolerated well, no acute distress noted.  

## 2015-01-31 NOTE — ED Notes (Signed)
RN attempted to call report to 2A, RN unable to take report at this time, states will call ED back in 5 mins.

## 2015-01-31 NOTE — ED Notes (Addendum)
Pt to triage via wheelchair.  Pt reports going to the bathroom, became dizzy and fell. No loc no vomiting.  No headache.   Pt has bil knee pain, neck pain.  Pt c/o dizzy.  Pt has parkinson's disease. Pt alert. c-collar placed in triage.

## 2015-01-31 NOTE — H&P (Signed)
[facility] [place] [facility]   PATIENT NAME: Lydia Church    MR#:  [id number]  DATE OF BIRTH:  1930/10/12  DATE OF ADMISSION:  01/31/2015  PRIMARY CARE PHYSICIAN: Pcp Not In System   REQUESTING/REFERRING PHYSICIAN: Scotty Court, MD  CHIEF COMPLAINT:   Chief Complaint  Patient presents with  . Fall  . Dizziness    HISTORY OF PRESENT ILLNESS:  Lydia Church  is a 82 y.o. female who presents with a fall at home. Patient states that she was in her bathroom when she fell today. She states that she "felt weak in her muscles all over". She states that she was too weak to get up from the floor and laid there for several hours until someone helped her up. In the ED her workup is largely benign except for an elevated troponin at 0.35. Patient does not have any overt chest pain, however she does have some right arm pain. ED physician felt this could be an anginal equivalent and started her on heparin. Hospitalist were then called for admission. Patient denies any prior cardiac history, however on monitoring ED she was noted to be going in and out of atrial fibrillation.  PAST MEDICAL HISTORY:   Past Medical History  Diagnosis Date  . Hypertension   . Glaucoma   . Parkinson's disease (HCC)   . Depression     never been treated for depression, has felt that way since Parkinson's   . GERD (gastroesophageal reflux disease)     uses gas ex as needed  . Headache(784.0)     because sinus problems  . Arthritis   . Anemia   . Cataract   . Legally blind in right eye, as defined in [place]     PAST SURGICAL HISTORY:   Past Surgical History  Procedure Laterality Date  . Eye surgery      fluid removed from eyes  . Abdominal hysterectomy    . Colonoscopy    . Trabeculectomy Left 09/01/2013    Procedure: TRABECULECTOMY LEFT EYE;  Surgeon: Chalmers Guest, MD;  Location: [facility] OR;  Service: Ophthalmology;  Laterality: Left;  Marland Kitchen Mitomycin c application Left 09/01/2013     Procedure: MITOMYCIN C APPLICATION;  Surgeon: Chalmers Guest, MD;  Location: [facility] OR;  Service: Ophthalmology;  Laterality: Left;  . Cataract extraction w/phaco Left 09/01/2013    Procedure: CATARACT EXTRACTION PHACO AND INTRAOCULAR LENS PLACEMENT (IOC);  Surgeon: Chalmers Guest, MD;  Location: [facility] OR;  Service: Ophthalmology;  Laterality: Left;    SOCIAL HISTORY:   Social History  Substance Use Topics  . Smoking status: Never Smoker   . Smokeless tobacco: Never Used  . Alcohol Use: No    FAMILY HISTORY:   Family History  Problem Relation Age of Onset  . Hypertension Mother     DRUG ALLERGIES:  No Known Allergies  MEDICATIONS AT HOME:   Prior to Admission medications   Medication Sig Start Date End Date Taking? Authorizing Provider  Brinzolamide-Brimonidine [facility]) 1-0.2 % SUSP Place 1 drop into the left eye 3 (three) times daily.   Yes Historical Provider, MD  carbidopa-levodopa (SINEMET IR) 25-100 MG per tablet Take 2 tablets by mouth 3 (three) times daily with meals.    Yes Historical Provider, MD  cetirizine (ZYRTEC) 10 MG tablet Take 10 mg by mouth daily.    Yes Historical Provider, MD  furosemide (LASIX) 20 MG tablet Take 20 mg by mouth daily.    Yes Historical Provider, MD  losartan (COZAAR) 50 MG tablet Take 50 mg by mouth daily.   Yes Historical Provider, MD    REVIEW OF SYSTEMS:  Review of Systems  Constitutional: Negative for fever, chills, weight loss and malaise/fatigue.  HENT: Negative for ear pain, hearing loss and tinnitus.   Eyes: Negative for blurred vision, double vision, pain and redness.  Respiratory: Negative for cough, hemoptysis and shortness of breath.   Cardiovascular: Negative for chest pain, palpitations, orthopnea and leg swelling.  Gastrointestinal: Negative for nausea, vomiting, abdominal pain, diarrhea and constipation.  Genitourinary: Negative for dysuria, frequency and hematuria.  Musculoskeletal: Positive for falls. Negative for back pain,  joint pain and neck pain.       Right arm pain  Skin:       No acne, rash, or lesions  Neurological: Positive for dizziness. Negative for tremors, focal weakness, seizures, loss of consciousness and weakness.  Endo/Heme/Allergies: Negative for polydipsia. Does not bruise/bleed easily.  Psychiatric/Behavioral: Negative for depression. The patient is not nervous/anxious and does not have insomnia.      VITAL SIGNS:   Filed Vitals:   01/31/15 1745 01/31/15 1851 01/31/15 2015 01/31/15 2021  BP: 133/79 144/95 141/84 119/84  Pulse: 86 88 87   Temp: 98.5 F (36.9 C)     TempSrc: Oral     Resp: 20 16 23    Height: 5\' 4"  (1.626 m)     Weight: 74.844 kg (165 lb)     SpO2: 99% 97% 98%    Wt Readings from Last 3 Encounters:  01/31/15 74.844 kg (165 lb)  09/01/13 95.029 kg (209 lb 8 oz)    PHYSICAL EXAMINATION:  Physical Exam  Vitals reviewed. Constitutional: She is oriented to person, place, and time. She appears well-developed and well-nourished. No distress.  HENT:  Head: Normocephalic and atraumatic.  Mouth/Throat: Oropharynx is clear and moist.  Eyes: Conjunctivae and EOM are normal. Pupils are equal, round, and reactive to light. No scleral icterus.  Neck: Normal range of motion. Neck supple. No JVD present. No thyromegaly present.  Cardiovascular: Normal rate and intact distal pulses.  Exam reveals no gallop and no friction rub.   No murmur heard. Irregular rhythm  Respiratory: Effort normal and breath sounds normal. No respiratory distress. She has no wheezes. She has no rales.  GI: Soft. Bowel sounds are normal. She exhibits no distension. There is no tenderness.  Musculoskeletal: Normal range of motion. She exhibits no edema.  No arthritis, no gout  Lymphadenopathy:    She has no cervical adenopathy.  Neurological: She is alert and oriented to person, place, and time. No cranial nerve deficit.  No dysarthria, no aphasia  Skin: Skin is warm and dry. No rash noted. No  erythema.  Psychiatric: She has a normal mood and affect. Her behavior is normal. Judgment and thought content normal.    LABORATORY PANEL:   CBC  Recent Labs Lab 01/31/15 1759  WBC 8.7  HGB 14.1  HCT 44.0  PLT 155   ------------------------------------------------------------------------------------------------------------------  Chemistries   Recent Labs Lab 01/31/15 1759  NA 140  K 3.7  CL 109  CO2 25  GLUCOSE 121*  BUN 21*  CREATININE 1.04*  CALCIUM 10.0  AST 30  ALT 7*  ALKPHOS 52  BILITOT 0.6   ------------------------------------------------------------------------------------------------------------------  Cardiac Enzymes  Recent Labs Lab 01/31/15 1759  TROPONINI 0.35*   ------------------------------------------------------------------------------------------------------------------  RADIOLOGY:  Ct Head Wo Contrast  01/31/2015  CLINICAL DATA:  Status post fall, neck pain EXAM: CT HEAD WITHOUT CONTRAST  CT CERVICAL SPINE WITHOUT CONTRAST TECHNIQUE: Multidetector CT imaging of the head and cervical spine was performed following the standard protocol without intravenous contrast. Multiplanar CT image reconstructions of the cervical spine were also generated. COMPARISON:  05/29/2012 FINDINGS: CT HEAD FINDINGS There is no evidence of mass effect, midline shift, or extra-axial fluid collections. There is no evidence of a space-occupying lesion or intracranial hemorrhage. There is no evidence of a cortical-based area of acute infarction. There is generalized cerebral atrophy. There is periventricular white matter low attenuation likely secondary to microangiopathy. The ventricles and sulci are appropriate for the patient's age. The basal cisterns are patent. Visualized portions of the orbits are unremarkable. The visualized portions of the paranasal sinuses and mastoid air cells are unremarkable. Cerebrovascular atherosclerotic calcifications are noted. The osseous  structures are unremarkable. CT CERVICAL SPINE FINDINGS The alignment is anatomic. The vertebral body heights are maintained. There is no acute fracture. There is no static listhesis. The prevertebral soft tissues are normal. The intraspinal soft tissues are not fully imaged on this examination due to poor soft tissue contrast, but there is no gross soft tissue abnormality. There is mild degenerative disc disease at C3-4 and C6-7. Broad central disc protrusion at C2-3. Small central disc protrusion at C3-4. The visualized portions of the lung apices demonstrate no focal abnormality. IMPRESSION: 1. No acute intracranial pathology. 2. No acute osseous injury of the cervical spine. Electronically Signed   By: Elige Ko   On: 01/31/2015 18:40   Ct Cervical Spine Wo Contrast  01/31/2015  CLINICAL DATA:  Status post fall, neck pain EXAM: CT HEAD WITHOUT CONTRAST CT CERVICAL SPINE WITHOUT CONTRAST TECHNIQUE: Multidetector CT imaging of the head and cervical spine was performed following the standard protocol without intravenous contrast. Multiplanar CT image reconstructions of the cervical spine were also generated. COMPARISON:  05/29/2012 FINDINGS: CT HEAD FINDINGS There is no evidence of mass effect, midline shift, or extra-axial fluid collections. There is no evidence of a space-occupying lesion or intracranial hemorrhage. There is no evidence of a cortical-based area of acute infarction. There is generalized cerebral atrophy. There is periventricular white matter low attenuation likely secondary to microangiopathy. The ventricles and sulci are appropriate for the patient's age. The basal cisterns are patent. Visualized portions of the orbits are unremarkable. The visualized portions of the paranasal sinuses and mastoid air cells are unremarkable. Cerebrovascular atherosclerotic calcifications are noted. The osseous structures are unremarkable. CT CERVICAL SPINE FINDINGS The alignment is anatomic. The vertebral  body heights are maintained. There is no acute fracture. There is no static listhesis. The prevertebral soft tissues are normal. The intraspinal soft tissues are not fully imaged on this examination due to poor soft tissue contrast, but there is no gross soft tissue abnormality. There is mild degenerative disc disease at C3-4 and C6-7. Broad central disc protrusion at C2-3. Small central disc protrusion at C3-4. The visualized portions of the lung apices demonstrate no focal abnormality. IMPRESSION: 1. No acute intracranial pathology. 2. No acute osseous injury of the cervical spine. Electronically Signed   By: Elige Ko   On: 01/31/2015 18:40   Dg Knee Complete 4 Views Left  01/31/2015  CLINICAL DATA:  Fall this morning. Bruising of both knees. Initial encounter. EXAM: LEFT KNEE - COMPLETE 4+ VIEW COMPARISON:  None. FINDINGS: There is no evidence of fracture, dislocation, or joint effusion. Nonspecific subcutaneous reticulation. IMPRESSION: No acute osseous finding. Electronically Signed   By: Marnee Spring M.D.   On: 01/31/2015 18:50  Dg Knee Complete 4 Views Right  01/31/2015  CLINICAL DATA:  Fall this morning with bruising to the bilateral knees. Initial encounter. EXAM: RIGHT KNEE - COMPLETE 4+ VIEW COMPARISON:  None. FINDINGS: Nonspecific diffuse subcutaneous reticulation. There is no evidence of fracture, dislocation, or joint effusion. IMPRESSION: No acute osseous finding. Electronically Signed   By: Marnee SpringJonathon  Watts M.D.   On: 01/31/2015 18:51   Dg Humerus Right  01/31/2015  CLINICAL DATA:  Fall with right humerus pain.  Initial encounter. EXAM: RIGHT HUMERUS - 2+ VIEW COMPARISON:  None. FINDINGS: There is no evidence of fracture or subluxation. No acute soft tissue finding. IMPRESSION: Negative right humerus. Electronically Signed   By: Marnee SpringJonathon  Watts M.D.   On: 01/31/2015 18:43    EKG:   Orders placed or performed during the hospital encounter of 01/31/15  . EKG 12-Lead  . EKG  12-Lead  . ED EKG  . ED EKG    IMPRESSION AND PLAN:  Principal Problem:   Elevated troponin - elevated 0.35. Patient was started on heparin drip. We will admit her with telemetry, cycle her cardiac enzymes, get a cardiology consult and echocardiogram in the morning. Active Problems:   Paroxysmal atrial fibrillation - newly diagnosed, seen on monitor, possibly contributory to her fall. Cardiac workup as above.   Fall - unclear if related to her arrhythmia, cardiac event, her Parkinson's disease. We'll monitor workup as above and below.   Right arm pain - felt to possibly be an anginal equivalent, patient given sublingual nitroglycerin, will monitor for beneficial effect.   Generalized weakness - unclear if related to her Parkinson's, potentially her newly diagnosed atrial fibrillation. She does state that her Parkinson's medication sometimes makes her feel drowsy. Either way we will get a PT/OT consult while she is here in the hospital.   HTN (hypertension) - blood pressure is at goal here, monitor closely and continue home meds for now.   Parkinson disease (HCC) - we will hold her Sinemet for now to see if this makes a difference with her weakness.   GERD (gastroesophageal reflux disease) - continue home meds   Arthritis - when necessary analgesic  All the records are reviewed and case discussed with ED provider. Management plans discussed with the patient and/or family.  DVT PROPHYLAXIS: Systemic anticoagulation  GI PROPHYLAXIS: H2 blocker  ADMISSION STATUS: Inpatient  CODE STATUS: DNR Code Status History    This patient does not have a recorded code status. Please follow your organizational policy for patients in this situation.      TOTAL TIME TAKING CARE OF THIS PATIENT: 45 minutes.    Melquiades Kovar FIELDING 01/31/2015, 8:45 PM  [facility]  Office  [phone]  CC: Primary care physician; Pcp Not In System

## 2015-01-31 NOTE — ED Notes (Signed)
MD Willis at bedside. 

## 2015-01-31 NOTE — ED Notes (Signed)
MD Stafford at bedside. 

## 2015-01-31 NOTE — ED Notes (Addendum)
Lab called regarding add on PT-INR, does not have blue tube in lab, RN will draw momentarily.

## 2015-02-01 ENCOUNTER — Inpatient Hospital Stay
Admit: 2015-02-01 | Discharge: 2015-02-01 | Disposition: A | Discharge status: Home / Self Care | Payer: Medicare Other | Attending: Internal Medicine | Admitting: Internal Medicine

## 2015-02-01 LAB — BASIC METABOLIC PANEL
ANION GAP: 3 — AB (ref 5–15)
BUN: 15 mg/dL (ref 6–20)
CHLORIDE: 111 mmol/L (ref 101–111)
CO2: 27 mmol/L (ref 22–32)
Calcium: 9.2 mg/dL (ref 8.9–10.3)
Creatinine, Ser: 0.86 mg/dL (ref 0.44–1.00)
GFR calc Af Amer: >60 mL/min (ref >60)
GLUCOSE: 88 mg/dL (ref 65–99)
POTASSIUM: 3.5 mmol/L (ref 3.5–5.1)
Sodium: 141 mmol/L (ref 135–145)

## 2015-02-01 LAB — CBC
HEMATOCRIT: 39 % (ref 35.0–47.0)
Hemoglobin: 12.8 g/dL (ref 12.0–16.0)
MCH: 31.5 pg (ref 26.0–34.0)
MCHC: 32.7 g/dL (ref 32.0–36.0)
MCV: 96.3 fL (ref 80.0–100.0)
PLATELETS: 140 10*3/uL — AB (ref 150–440)
RBC: 4.06 MIL/uL (ref 3.80–5.20)
RDW: 15.2 % — AB (ref 11.5–14.5)
WBC: 5.9 10*3/uL (ref 3.6–11.0)

## 2015-02-01 LAB — HEPARIN LEVEL (UNFRACTIONATED)
Heparin Unfractionated: 0.3 IU/mL (ref 0.30–0.70)
Heparin Unfractionated: 0.29 IU/mL — ABNORMAL LOW (ref 0.30–0.70)

## 2015-02-01 LAB — TROPONIN I
TROPONIN I: 0.18 ng/mL — AB (ref <0.031)
Troponin I: 0.28 ng/mL — ABNORMAL HIGH (ref <0.031)
Troponin I: 0.4 ng/mL — ABNORMAL HIGH (ref <0.031)

## 2015-02-01 MED ORDER — LORATADINE 10 MG PO TABS
10.0000 mg | ORAL_TABLET | Freq: Every day | ORAL | Status: DC
  Administered 2015-02-01: 10 mg via ORAL
  Filled 2015-02-01: qty 1

## 2015-02-01 MED ORDER — CARBIDOPA-LEVODOPA 25-100 MG PO TABS
2.0000 | ORAL_TABLET | Freq: Three times a day (TID) | ORAL | Status: DC
  Administered 2015-02-01 (×2): 2 via ORAL
  Filled 2015-02-01 (×2): qty 2

## 2015-02-01 MED ORDER — FUROSEMIDE 20 MG PO TABS
20.0000 mg | ORAL_TABLET | Freq: Every day | ORAL | Status: DC
  Administered 2015-02-01: 20 mg via ORAL
  Filled 2015-02-01: qty 1

## 2015-02-01 NOTE — Consult Note (Addendum)
Reason for Consult: atrial fibrillation and weakness Referring Physician:  Dr. Fritzi Mandes  Lydia Church is an 82 y.o. female.  HPI:  Patient is a 82 year old female Parkinson's disease weakness fell within the muscles got weak she has significant tremors from her Parkinson's. Patient denies any significant chest pain.  She has a history of atrial fibrillation. Patient is found have elevated troponin patient is admitted by the hospitalist troponins trended downward denied any chest pain weakness is slightly improved.  Denies any  Any previous cardiac history feels reasonably well except for generalized weakness the patient is word that the Parkinson's medicines making her weak and fall. Denies blackout spells of syncope just states she lost her balance and because of weakness  Past Medical History  Diagnosis Date  . Hypertension   . Glaucoma   . Parkinson's disease ([place])   . Depression     never been treated for depression, has felt that way since Parkinson's   . GERD (gastroesophageal reflux disease)     uses gas ex as needed  . Headache(784.0)     because sinus problems  . Arthritis   . Anemia   . Cataract   . Legally blind in right eye, as defined in [place]     Past Surgical History  Procedure Laterality Date  . Eye surgery      fluid removed from eyes  . Abdominal hysterectomy    . Colonoscopy    . Trabeculectomy Left 09/01/2013    Procedure: TRABECULECTOMY LEFT EYE;  Surgeon: Marylynn Pearson, MD;  Location: [place];  Service: Ophthalmology;  Laterality: Left;  Marland Kitchen Mitomycin c application Left 9/37/9024    Procedure: MITOMYCIN C APPLICATION;  Surgeon: Marylynn Pearson, MD;  Location: [place];  Service: Ophthalmology;  Laterality: Left;  . Cataract extraction w/phaco Left 09/01/2013    Procedure: CATARACT EXTRACTION PHACO AND INTRAOCULAR LENS PLACEMENT (IOC);  Surgeon: Marylynn Pearson, MD;  Location: [place];  Service: Ophthalmology;  Laterality: Left;    Family History  Problem Relation Age of  Onset  . Hypertension Mother     Social History:  reports that she has never smoked. She has never used smokeless tobacco. She reports that she does not drink alcohol or use illicit drugs.  Allergies: No Known Allergies  Medications: I have reviewed the patient's current medications.  Results for orders placed or performed during the hospital encounter of 01/31/15 (from the past 48 hour(s))  CBC     Status: Abnormal   Collection Time: 01/31/15  5:59 PM  Result Value Ref Range   WBC 8.7 3.6 - 11.0 K/uL   RBC 4.68 3.80 - 5.20 MIL/uL   Hemoglobin 14.1 12.0 - 16.0 g/dL   HCT 44.0 35.0 - 47.0 %   MCV 94.0 80.0 - 100.0 fL   MCH 30.2 26.0 - 34.0 pg   MCHC 32.2 32.0 - 36.0 g/dL   RDW 15.4 (H) 11.5 - 14.5 %   Platelets 155 150 - 440 K/uL  Comprehensive metabolic panel     Status: Abnormal   Collection Time: 01/31/15  5:59 PM  Result Value Ref Range   Sodium 140 135 - 145 mmol/L   Potassium 3.7 3.5 - 5.1 mmol/L   Chloride 109 101 - 111 mmol/L   CO2 25 22 - 32 mmol/L   Glucose, Bld 121 (H) 65 - 99 mg/dL   BUN 21 (H) 6 - 20 mg/dL   Creatinine, Ser 1.04 (H) 0.44 - 1.00 mg/dL   Calcium 10.0  8.9 - 10.3 mg/dL   Total Protein 6.7 6.5 - 8.1 g/dL   Albumin 3.9 3.5 - 5.0 g/dL   AST 30 15 - 41 U/L   ALT 7 (L) 14 - 54 U/L   Alkaline Phosphatase 52 38 - 126 U/L   Total Bilirubin 0.6 0.3 - 1.2 mg/dL   GFR calc non Af Amer 49 (L) >60 mL/min   GFR calc Af Amer 56 (L) >60 mL/min    Comment: (NOTE) The eGFR has been calculated using the CKD EPI equation. This calculation has not been validated in all clinical situations. eGFR's persistently <60 mL/min signify possible Chronic Kidney Disease.    Anion gap 6 5 - 15  Troponin I     Status: Abnormal   Collection Time: 01/31/15  5:59 PM  Result Value Ref Range   Troponin I 0.35 (H) <0.031 ng/mL    Comment: READ BACK AND VERIFIED WITH LAUREN CLOUDEN AT 1907 01/31/2015 BY TFK        PERSISTENTLY INCREASED TROPONIN VALUES IN THE RANGE OF  0.04-0.49 ng/mL CAN BE SEEN IN:       -UNSTABLE ANGINA       -CONGESTIVE HEART FAILURE       -MYOCARDITIS       -CHEST TRAUMA       -ARRYHTHMIAS       -LATE PRESENTING MYOCARDIAL INFARCTION       -COPD   CLINICAL FOLLOW-UP RECOMMENDED.   Urinalysis complete, with microscopic     Status: Abnormal   Collection Time: 01/31/15  7:27 PM  Result Value Ref Range   Color, Urine YELLOW (A) YELLOW   APPearance CLEAR (A) CLEAR   Glucose, UA NEGATIVE NEGATIVE mg/dL   Bilirubin Urine NEGATIVE NEGATIVE   Ketones, ur 1+ (A) NEGATIVE mg/dL   Specific Gravity, Urine 1.020 1.005 - 1.030   Hgb urine dipstick 1+ (A) NEGATIVE   pH 5.0 5.0 - 8.0   Protein, ur NEGATIVE NEGATIVE mg/dL   Nitrite NEGATIVE NEGATIVE   Leukocytes, UA TRACE (A) NEGATIVE   RBC / HPF 0-5 0 - 5 RBC/hpf   WBC, UA 0-5 0 - 5 WBC/hpf   Bacteria, UA NONE SEEN NONE SEEN   Squamous Epithelial / LPF 0-5 (A) NONE SEEN   Mucous PRESENT   APTT     Status: None   Collection Time: 01/31/15  8:15 PM  Result Value Ref Range   aPTT 33 24 - 36 seconds  Protime-INR     Status: None   Collection Time: 01/31/15  8:15 PM  Result Value Ref Range   Prothrombin Time 14.6 11.4 - 15.0 seconds   INR 1.12   Troponin I     Status: Abnormal   Collection Time: 01/31/15 11:39 PM  Result Value Ref Range   Troponin I 0.40 (H) <0.031 ng/mL    Comment: PREVIOUS RESULT CALLED LAUREN CLOUDEN ON 01/31/15 AT 1907PM BY TFK/TLB        PERSISTENTLY INCREASED TROPONIN VALUES IN THE RANGE OF 0.04-0.49 ng/mL CAN BE SEEN IN:       -UNSTABLE ANGINA       -CONGESTIVE HEART FAILURE       -MYOCARDITIS       -CHEST TRAUMA       -ARRYHTHMIAS       -LATE PRESENTING MYOCARDIAL INFARCTION       -COPD   CLINICAL FOLLOW-UP RECOMMENDED.   Heparin level (unfractionated)     Status: None   Collection Time: 02/01/15  5:14 AM  Result Value Ref Range   Heparin Unfractionated 0.30 0.30 - 0.70 IU/mL    Comment:        IF HEPARIN RESULTS ARE BELOW EXPECTED VALUES, AND  PATIENT DOSAGE HAS BEEN CONFIRMED, SUGGEST FOLLOW UP TESTING OF ANTITHROMBIN III LEVELS.   CBC     Status: Abnormal   Collection Time: 02/01/15  5:14 AM  Result Value Ref Range   WBC 5.9 3.6 - 11.0 K/uL   RBC 4.06 3.80 - 5.20 MIL/uL   Hemoglobin 12.8 12.0 - 16.0 g/dL   HCT 39.0 35.0 - 47.0 %   MCV 96.3 80.0 - 100.0 fL   MCH 31.5 26.0 - 34.0 pg   MCHC 32.7 32.0 - 36.0 g/dL   RDW 15.2 (H) 11.5 - 14.5 %   Platelets 140 (L) 150 - 440 K/uL  Troponin I     Status: Abnormal   Collection Time: 02/01/15  5:14 AM  Result Value Ref Range   Troponin I 0.28 (H) <0.031 ng/mL    Comment: PREVIOUS RESULT CALLED TO LAUREN CLOUDEN ON 01/31/15 AT 1907 BY TFK/QSD        PERSISTENTLY INCREASED TROPONIN VALUES IN THE RANGE OF 0.04-0.49 ng/mL CAN BE SEEN IN:       -UNSTABLE ANGINA       -CONGESTIVE HEART FAILURE       -MYOCARDITIS       -CHEST TRAUMA       -ARRYHTHMIAS       -LATE PRESENTING MYOCARDIAL INFARCTION       -COPD   CLINICAL FOLLOW-UP RECOMMENDED.   Basic metabolic panel     Status: Abnormal   Collection Time: 02/01/15  5:14 AM  Result Value Ref Range   Sodium 141 135 - 145 mmol/L   Potassium 3.5 3.5 - 5.1 mmol/L   Chloride 111 101 - 111 mmol/L   CO2 27 22 - 32 mmol/L   Glucose, Bld 88 65 - 99 mg/dL   BUN 15 6 - 20 mg/dL   Creatinine, Ser 0.86 0.44 - 1.00 mg/dL   Calcium 9.2 8.9 - 10.3 mg/dL   GFR calc non Af Amer >60 >60 mL/min   GFR calc Af Amer >60 >60 mL/min    Comment: (NOTE) The eGFR has been calculated using the CKD EPI equation. This calculation has not been validated in all clinical situations. eGFR's persistently <60 mL/min signify possible Chronic Kidney Disease.    Anion gap 3 (L) 5 - 15  Troponin I     Status: Abnormal   Collection Time: 02/01/15 11:30 AM  Result Value Ref Range   Troponin I 0.18 (H) <0.031 ng/mL    Comment: PREVIOUS RESULT CALLED ON 01/31/15 AT 1907 BY QSD/SGD        PERSISTENTLY INCREASED TROPONIN VALUES IN THE RANGE OF 0.04-0.49 ng/mL  CAN BE SEEN IN:       -UNSTABLE ANGINA       -CONGESTIVE HEART FAILURE       -MYOCARDITIS       -CHEST TRAUMA       -ARRYHTHMIAS       -LATE PRESENTING MYOCARDIAL INFARCTION       -COPD   CLINICAL FOLLOW-UP RECOMMENDED.   Heparin level (unfractionated)     Status: Abnormal   Collection Time: 02/01/15 12:36 PM  Result Value Ref Range   Heparin Unfractionated 0.29 (L) 0.30 - 0.70 IU/mL    Comment:        IF HEPARIN RESULTS ARE BELOW  EXPECTED VALUES, AND PATIENT DOSAGE HAS BEEN CONFIRMED, SUGGEST FOLLOW UP TESTING OF ANTITHROMBIN III LEVELS.     Ct Head Wo Contrast  01/31/2015  CLINICAL DATA:  Status post fall, neck pain EXAM: CT HEAD WITHOUT CONTRAST CT CERVICAL SPINE WITHOUT CONTRAST TECHNIQUE: Multidetector CT imaging of the head and cervical spine was performed following the standard protocol without intravenous contrast. Multiplanar CT image reconstructions of the cervical spine were also generated. COMPARISON:  05/29/2012 FINDINGS: CT HEAD FINDINGS There is no evidence of mass effect, midline shift, or extra-axial fluid collections. There is no evidence of a space-occupying lesion or intracranial hemorrhage. There is no evidence of a cortical-based area of acute infarction. There is generalized cerebral atrophy. There is periventricular white matter low attenuation likely secondary to microangiopathy. The ventricles and sulci are appropriate for the patient's age. The basal cisterns are patent. Visualized portions of the orbits are unremarkable. The visualized portions of the paranasal sinuses and mastoid air cells are unremarkable. Cerebrovascular atherosclerotic calcifications are noted. The osseous structures are unremarkable. CT CERVICAL SPINE FINDINGS The alignment is anatomic. The vertebral body heights are maintained. There is no acute fracture. There is no static listhesis. The prevertebral soft tissues are normal. The intraspinal soft tissues are not fully imaged on this  examination due to poor soft tissue contrast, but there is no gross soft tissue abnormality. There is mild degenerative disc disease at C3-4 and C6-7. Broad central disc protrusion at C2-3. Small central disc protrusion at C3-4. The visualized portions of the lung apices demonstrate no focal abnormality. IMPRESSION: 1. No acute intracranial pathology. 2. No acute osseous injury of the cervical spine. Electronically Signed   By: Kathreen Devoid   On: 01/31/2015 18:40   Ct Cervical Spine Wo Contrast  01/31/2015  CLINICAL DATA:  Status post fall, neck pain EXAM: CT HEAD WITHOUT CONTRAST CT CERVICAL SPINE WITHOUT CONTRAST TECHNIQUE: Multidetector CT imaging of the head and cervical spine was performed following the standard protocol without intravenous contrast. Multiplanar CT image reconstructions of the cervical spine were also generated. COMPARISON:  05/29/2012 FINDINGS: CT HEAD FINDINGS There is no evidence of mass effect, midline shift, or extra-axial fluid collections. There is no evidence of a space-occupying lesion or intracranial hemorrhage. There is no evidence of a cortical-based area of acute infarction. There is generalized cerebral atrophy. There is periventricular white matter low attenuation likely secondary to microangiopathy. The ventricles and sulci are appropriate for the patient's age. The basal cisterns are patent. Visualized portions of the orbits are unremarkable. The visualized portions of the paranasal sinuses and mastoid air cells are unremarkable. Cerebrovascular atherosclerotic calcifications are noted. The osseous structures are unremarkable. CT CERVICAL SPINE FINDINGS The alignment is anatomic. The vertebral body heights are maintained. There is no acute fracture. There is no static listhesis. The prevertebral soft tissues are normal. The intraspinal soft tissues are not fully imaged on this examination due to poor soft tissue contrast, but there is no gross soft tissue abnormality. There  is mild degenerative disc disease at C3-4 and C6-7. Broad central disc protrusion at C2-3. Small central disc protrusion at C3-4. The visualized portions of the lung apices demonstrate no focal abnormality. IMPRESSION: 1. No acute intracranial pathology. 2. No acute osseous injury of the cervical spine. Electronically Signed   By: Kathreen Devoid   On: 01/31/2015 18:40   Dg Knee Complete 4 Views Left  01/31/2015  CLINICAL DATA:  Fall this morning. Bruising of both knees. Initial encounter. EXAM: LEFT KNEE -  COMPLETE 4+ VIEW COMPARISON:  None. FINDINGS: There is no evidence of fracture, dislocation, or joint effusion. Nonspecific subcutaneous reticulation. IMPRESSION: No acute osseous finding. Electronically Signed   By: Monte Fantasia M.D.   On: 01/31/2015 18:50   Dg Knee Complete 4 Views Right  01/31/2015  CLINICAL DATA:  Fall this morning with bruising to the bilateral knees. Initial encounter. EXAM: RIGHT KNEE - COMPLETE 4+ VIEW COMPARISON:  None. FINDINGS: Nonspecific diffuse subcutaneous reticulation. There is no evidence of fracture, dislocation, or joint effusion. IMPRESSION: No acute osseous finding. Electronically Signed   By: Monte Fantasia M.D.   On: 01/31/2015 18:51   Dg Humerus Right  01/31/2015  CLINICAL DATA:  Fall with right humerus pain.  Initial encounter. EXAM: RIGHT HUMERUS - 2+ VIEW COMPARISON:  None. FINDINGS: There is no evidence of fracture or subluxation. No acute soft tissue finding. IMPRESSION: Negative right humerus. Electronically Signed   By: Monte Fantasia M.D.   On: 01/31/2015 18:43    Review of Systems  Constitutional: Positive for malaise/fatigue.  HENT: Negative.   Eyes: Negative.   Respiratory: Positive for shortness of breath.   Cardiovascular: Negative.   Gastrointestinal: Negative.   Genitourinary: Negative.   Musculoskeletal: Positive for myalgias.  Skin: Negative.   Neurological: Positive for dizziness, tingling, tremors, focal weakness and weakness.   Endo/Heme/Allergies: Negative.   Psychiatric/Behavioral: Negative.    Blood pressure 114/63, pulse 55, temperature 97.9 F (36.6 C), temperature source Oral, resp. rate 18, height 5' 4"  (1.626 m), weight 90.22 kg (198 lb 14.4 oz), SpO2 98 %. Physical Exam  Constitutional: She appears well-developed and well-nourished. She appears lethargic.  HENT:  Head: Normocephalic and atraumatic.  Eyes: Conjunctivae and EOM are normal. Pupils are equal, round, and reactive to light.  Neck: Normal range of motion. Neck supple.  Cardiovascular: Normal rate and regular rhythm.   Respiratory: Effort normal and breath sounds normal.  GI: Soft. Bowel sounds are normal.  Neurological: She appears lethargic. She displays tremor. She exhibits abnormal muscle tone. Coordination and gait abnormal. GCS eye subscore is 4. GCS verbal subscore is 5. GCS motor subscore is 6.  Skin: Skin is warm and dry.  Psychiatric: She has a normal mood and affect.    Assessment/Plan:  fall  Parkinson's  atrial fibrillation  hypertension  GERD  elevated troponin  right arm pain  DJD  generalized weakness . PLAN  agree with admitted for rule out for myocardial infarction  continue with Parkinson's therapy  rate control for atrial fibrillation  I do not recommend long-term anticoagulation because is a poor candidate  hypertension control with losartan  continue Claritin for sinus trouble  continue Lasix for mild edema  echocardiogram will be helpful for further evaluation  outpatient follow-up as an outpatient cardiology  Blaine D. 02/01/2015, 2:57 PM

## 2015-02-01 NOTE — Progress Notes (Signed)
OT Cancellation Note  Patient Details Name: Lydia Church MRN: [id number] DOB: [birth year]   Cancelled Treatment:    Reason Eval/Treat Not Completed: Medical issues which prohibited therapy. Patient with elevated troponin and cardiology consult and not yes seen. Will hold until after cardiology.   Ocie CornfieldHuff, Helon Wisinski M 02/01/2015, 9:45 AM

## 2015-02-01 NOTE — Progress Notes (Signed)
Patient remains alert and responsive, denies any pain or discomfort, cardiology consult completed,  Heparin drip D/C as per order, family at bedside, no complaints voiced at this time.

## 2015-02-01 NOTE — Progress Notes (Signed)
Echocardiogram 2D Echocardiogram has been performed.  Dorothey BasemanReel, Renessa Wellnitz M 02/01/2015, 11:54 AM

## 2015-02-01 NOTE — Discharge Instructions (Signed)
HHPT °

## 2015-02-01 NOTE — Progress Notes (Signed)
Physical Therapy Evaluation Patient Details Name: Lydia Church MRN: [id number] DOB: 07/19/1930 Today's Date: 02/01/2015   History of Present Illness  Lydia Church is a 82 y.o. female who presents with a fall at home. Patient states that she was in her bathroom when she fell today. She states that she "felt weak in her muscles all over". She states that she was too weak to get up from the floor and laid there for several hours until someone helped her up. In the ED her workup is largely benign except for an elevated troponin at 0.35.  Clinical Impression  Pt presents with decreased gait related to visual impairments.  Pt has 5/5 strength testing in LE's and demonstrated sit<>stand transfers with modified independence.  Pt required Min A with ambulation using RW for visual guidance and safety with RW.  Pt would  Benefit from additional training with RW as pt uses a cane at home but would like to start using the RW for additional stability.  Pt would benefit from acute PT services to address objective findings.      Follow Up Recommendations Home health PT    Equipment Recommendations  Rolling walker with 5" wheels    Recommendations for Other Services       Precautions / Restrictions Precautions Precautions: Fall Restrictions Weight Bearing Restrictions: No      Mobility  Bed Mobility               General bed mobility comments: Pt received up in chair.  Transfers Overall transfer level: Modified independent Equipment used: Rolling walker (2 wheeled)             General transfer comment: Good control with sit<>stand transfers.  Ambulation/Gait Ambulation/Gait assistance: Min assist Ambulation Distance (Feet): 30 Feet Assistive device: Rolling walker (2 wheeled)       General Gait Details: Min A for vision guidance due to impaired vision (almost none); gait slow and cautious with verbal cues for safety with walker.  Stairs            Wheelchair Mobility     Modified Rankin (Stroke Patients Only)       Balance Overall balance assessment: Modified Independent                                           Pertinent Vitals/Pain Pain Assessment: 0-10 Pain Location: bilateral knees from fall    Home Living Family/patient expects to be discharged to:: Private residence Living Arrangements: Alone Available Help at Discharge: Family Type of Home: House       Home Layout: One level Home Equipment: Gilmer MorCane - single point      Prior Function Level of Independence: Independent with assistive device(s)         Comments: Uses a cane for stabiltiy, son lives "[place]" and checks on pt daily.     Hand Dominance        Extremity/Trunk Assessment   Upper Extremity Assessment: Overall WFL for tasks assessed           Lower Extremity Assessment: Overall WFL for tasks assessed         Communication   Communication: No difficulties  Cognition Arousal/Alertness: Awake/alert Behavior During Therapy: WFL for tasks assessed/performed Overall Cognitive Status: Within Functional Limits for tasks assessed  General Comments General comments (skin integrity, edema, etc.): abrasions to bilateral knees; blind R eye and limited L eye sight.    Exercises        Assessment/Plan    PT Assessment Patient needs continued PT services  PT Diagnosis Difficulty walking;Acute pain   PT Problem List Decreased mobility;Decreased knowledge of use of DME;Decreased safety awareness  PT Treatment Interventions Gait training;Therapeutic exercise;Patient/family education   PT Goals (Current goals can be found in the Care Plan section) Acute Rehab PT Goals Patient Stated Goal: Return home. PT Goal Formulation: With patient Time For Goal Achievement: 02/01/15 Potential to Achieve Goals: Good    Frequency Min 2X/week   Barriers to discharge Decreased caregiver support      Co-evaluation                End of Session Equipment Utilized During Treatment: Gait belt Activity Tolerance: Patient tolerated treatment well Patient left: in chair;with family/visitor present Nurse Communication: Mobility status    Functional Assessment Tool Used: clinical judgement Functional Limitation: Mobility: Walking and moving around Mobility: Walking and Moving Around Current Status [phone]): At least 40 percent but less than 60 percent impaired, limited or restricted Mobility: Walking and Moving Around Goal Status [phone]): At least 20 percent but less than 40 percent impaired, limited or restricted    Time: 1005-1045 PT Time Calculation (min) (ACUTE ONLY): 40 min   Charges:   PT Evaluation $PT Eval Low Complexity: 1 Procedure PT Treatments $Gait Training: 8-22 mins   PT G Codes:   PT G-Codes **NOT FOR INPATIENT CLASS** Functional Assessment Tool Used: clinical judgement Functional Limitation: Mobility: Walking and moving around Mobility: Walking and Moving Around Current Status ([id number]): At least 40 percent but less than 60 percent impaired, limited or restricted Mobility: Walking and Moving Around Goal Status [phone]): At least 20 percent but less than 40 percent impaired, limited or restricted    Azelea Seguin A Chrishaun Sasso 02/01/2015, 1:55 PM

## 2015-02-01 NOTE — Progress Notes (Signed)
Received order to  discharge patient home, discharge instruction provided, case management complete,  IV removed, NSR on the cardiac monitor.  tele removed, patient discharge home.

## 2015-02-01 NOTE — Care Management (Signed)
Patient for discharge home today.  She lives alone and family checks on her through the day.  Family has been looking into [facility] [place].  [place] and [place] [facility] [place] should family wish to inquire.  Also discussed personal care services through medicaid.  Provided application and instructed to take to PCP Karrie Doffing([facility]) for completion and have it faxed. No agency preference for home health or DME.  Would not want an initial visit before Friday.  Referral to [facility].  Family asks about process for obtaining lift chair.  Will will discuss process with family when he delivers the bedside commode and walker

## 2015-02-01 NOTE — Progress Notes (Signed)
ANTICOAGULATION CONSULT NOTE - Initial Consult  Pharmacy Consult for Heparin  Indication: chest pain/ACS  No Known Allergies  Patient Measurements: Height: 5\' 4"  (162.6 cm) Weight: 198 lb 14.4 oz (90.22 kg) IBW/kg (Calculated) : 54.7 Heparin Dosing Weight: 70.3 kg   Vital Signs: Temp: 98 F (36.7 C) (01/11 0425) Temp Source: Oral (01/11 0425) BP: 112/55 mmHg (01/11 0425) Pulse Rate: 66 (01/11 0425)  Labs:  Recent Labs  01/31/15 1759 01/31/15 2015 01/31/15 2339 02/01/15 0514  HGB 14.1  --   --  12.8  HCT 44.0  --   --  39.0  PLT 155  --   --  140*  APTT  --  33  --   --   LABPROT  --  14.6  --   --   INR  --  1.12  --   --   HEPARINUNFRC  --   --   --  0.30  CREATININE 1.04*  --   --  0.86  TROPONINI 0.35*  --  0.40* 0.28*    Estimated Creatinine Clearance: 54.9 mL/min (by C-G formula based on Cr of 0.86).   Medical History: Past Medical History  Diagnosis Date  . Hypertension   . Glaucoma   . Parkinson's disease (HCC)   . Depression     never been treated for depression, has felt that way since Parkinson's   . GERD (gastroesophageal reflux disease)     uses gas ex as needed  . Headache(784.0)     because sinus problems  . Arthritis   . Anemia   . Cataract   . Legally blind in right eye, as defined in [place]     Medications:  Prescriptions prior to admission  Medication Sig Dispense Refill Last Dose  . Brinzolamide-Brimonidine (SIMBRINZA) 1-0.2 % SUSP Place 1 drop into the left eye 3 (three) times daily.   01/31/2015 at Unknown time  . carbidopa-levodopa (SINEMET IR) 25-100 MG per tablet Take 2 tablets by mouth 3 (three) times daily with meals.    01/31/2015 at Unknown time  . cetirizine (ZYRTEC) 10 MG tablet Take 10 mg by mouth daily.    01/30/2015 at Unknown time  . furosemide (LASIX) 20 MG tablet Take 20 mg by mouth daily.    01/30/2015 at Unknown time  . losartan (COZAAR) 50 MG tablet Take 50 mg by mouth daily.   01/30/2015 at Unknown time     Assessment: Pharmacy consulted to dose heparin in this 82 year old female admitted with ACS.  CrCl = 41.3 ml/min No prior anticoag noted.  Goal of Therapy:  Heparin level 0.3-0.7 units/ml Monitor platelets by anticoagulation protocol: Yes   Plan:  Will order Heparin 4000 units IV X 1 bolus followed by heparin gtt to start at 850 units/hr. Will order baseline INR. Will draw 1st HL 8 hrs after start of drip on 1/11 @ 0500.  Will check CBC daily.   1/11 AM heparin level 0.3. Continue current regimen and recheck in 8 hours to confirm.  Serenity Fortner S 02/01/2015,6:08 AM

## 2015-02-01 NOTE — Discharge Summary (Signed)
[facility] - [place] at [facility]   PATIENT NAME: Lydia Church    MR#:  [id number]  DATE OF BIRTH:  [birth year]  DATE OF ADMISSION:  01/31/2015 ADMITTING PHYSICIAN: Oralia Manis, MD  DATE OF DISCHARGE: 02/01/15  PRIMARY CARE PHYSICIAN: Pcp Not In System    ADMISSION DIAGNOSIS:  Elevated troponin I level [R79.89] Near syncope [R55]  DISCHARGE DIAGNOSIS:  Elevated troponin w/o Cp-no further w/u needed at present Generalized weakness H/o Parkinson's dz SECONDARY DIAGNOSIS:   Past Medical History  Diagnosis Date  . Hypertension   . Glaucoma   . Parkinson's disease (HCC)   . Depression     never been treated for depression, has felt that way since Parkinson's   . GERD (gastroesophageal reflux disease)     uses gas ex as needed  . Headache(784.0)     because sinus problems  . Arthritis   . Anemia   . Cataract   . Legally blind in right eye, as defined in [place]     HOSPITAL COURSE:  Lydia Church is a 82 y.o. female who presents with a fall at home. Patient states that she was in her bathroom when she fell today. She states that she "felt weak in her muscles all over". She states that she was too weak to get up from the floor and laid there for several hours until someone helped her up  1.Elevated troponin - elevated 0.35--0.4--0.18 -no cp, no acute EKG changes -seen by dr Juliann Pares f/u echo in the office -ok to d/c Patient  heparin drip.   2 Paroxysmal atrial fibrillation - newly diagnosed, seen on monitor, possibly contributory to her fall. Cardiac workup as above. -now in SR  3. Fall appears mechanical Seen by PT recommends HHPT  4, HTN (hypertension) - blood pressure is at goal here, monitor closely and continue home meds for now.\  5. Parkinson disease (HCC) - resume her sinenmet  6. GERD (gastroesophageal reflux disease) - continue home meds   7.Arthritis - when necessary analgesic  Overall stable. Ok to d/c from cardiac  standpoint  CONSULTS OBTAINED:  Treatment Team:  Alwyn Pea, MD  DRUG ALLERGIES:  No Known Allergies  DISCHARGE MEDICATIONS:   Current Discharge Medication List    CONTINUE these medications which have NOT CHANGED   Details  Brinzolamide-Brimonidine (SIMBRINZA) 1-0.2 % SUSP Place 1 drop into the left eye 3 (three) times daily.    carbidopa-levodopa (SINEMET IR) 25-100 MG per tablet Take 2 tablets by mouth 3 (three) times daily with meals.     cetirizine (ZYRTEC) 10 MG tablet Take 10 mg by mouth daily.     furosemide (LASIX) 20 MG tablet Take 20 mg by mouth daily.     losartan (COZAAR) 50 MG tablet Take 50 mg by mouth daily.        If you experience worsening of your admission symptoms, develop shortness of breath, life threatening emergency, suicidal or homicidal thoughts you must seek medical attention immediately by calling 911 or calling your MD immediately  if symptoms less severe.  You Must read complete instructions/literature along with all the possible adverse reactions/side effects for all the Medicines you take and that have been prescribed to you. Take any new Medicines after you have completely understood and accept all the possible adverse reactions/side effects.   Please note  You were cared for by a hospitalist during your hospital stay. If you have any questions about your discharge medications or the care you  received while you were in the hospital after you are discharged, you can call the unit and asked to speak with the hospitalist on call if the hospitalist that took care of you is not available. Once you are discharged, your primary care physician will handle any further medical issues. Please note that NO REFILLS for any discharge medications will be authorized once you are discharged, as it is imperative that you return to your primary care physician (or establish a relationship with a primary care physician if you do not have one) for your aftercare  needs so that they can reassess your need for medications and monitor your lab values. Today   SUBJECTIVE   Bruised knees  VITAL SIGNS:  Blood pressure 114/63, pulse 55, temperature 97.9 F (36.6 C), temperature source Oral, resp. rate 18, height 5\' 4"  (1.626 m), weight 90.22 kg (198 lb 14.4 oz), SpO2 98 %.  I/O:   Intake/Output Summary (Last 24 hours) at 02/01/15 1351 Last data filed at 02/01/15 1024  Gross per 24 hour  Intake      0 ml  Output    200 ml  Net   -200 ml    PHYSICAL EXAMINATION:  GENERAL:  82 y.o.-year-old patient lying in the bed with no acute distress.  EYES: Pupils equal, round, reactive to light and accommodation. No scleral icterus. Extraocular muscles intact.  HEENT: Head atraumatic, normocephalic. Oropharynx and nasopharynx clear.  NECK:  Supple, no jugular venous distention. No thyroid enlargement, no tenderness.  LUNGS: Normal breath sounds bilaterally, no wheezing, rales,rhonchi or crepitation. No use of accessory muscles of respiration.  CARDIOVASCULAR: S1, S2 normal. No murmurs, rubs, or gallops.  ABDOMEN: Soft, non-tender, non-distended. Bowel sounds present. No organomegaly or mass.  EXTREMITIES: No pedal edema, cyanosis, or clubbing. Bruised knees NEUROLOGIC: Cranial nerves II through XII are intact. Muscle strength 5/5 in all extremities. Sensation intact. Gait not checked.  PSYCHIATRIC: The patient is alert and oriented x 3.  SKIN: No obvious rash, lesion, or ulcer.   DATA REVIEW:   CBC   Recent Labs Lab 02/01/15 0514  WBC 5.9  HGB 12.8  HCT 39.0  PLT 140*    Chemistries   Recent Labs Lab 01/31/15 1759 02/01/15 0514  NA 140 141  K 3.7 3.5  CL 109 111  CO2 25 27  GLUCOSE 121* 88  BUN 21* 15  CREATININE 1.04* 0.86  CALCIUM 10.0 9.2  AST 30  --   ALT 7*  --   ALKPHOS 52  --   BILITOT 0.6  --     Microbiology Results   No results found for this or any previous visit (from the past 240 hour(s)).  RADIOLOGY:  Ct Head  Wo Contrast  01/31/2015  CLINICAL DATA:  Status post fall, neck pain EXAM: CT HEAD WITHOUT CONTRAST CT CERVICAL SPINE WITHOUT CONTRAST TECHNIQUE: Multidetector CT imaging of the head and cervical spine was performed following the standard protocol without intravenous contrast. Multiplanar CT image reconstructions of the cervical spine were also generated. COMPARISON:  05/29/2012 FINDINGS: CT HEAD FINDINGS There is no evidence of mass effect, midline shift, or extra-axial fluid collections. There is no evidence of a space-occupying lesion or intracranial hemorrhage. There is no evidence of a cortical-based area of acute infarction. There is generalized cerebral atrophy. There is periventricular white matter low attenuation likely secondary to microangiopathy. The ventricles and sulci are appropriate for the patient's age. The basal cisterns are patent. Visualized portions of the orbits are unremarkable. The  visualized portions of the paranasal sinuses and mastoid air cells are unremarkable. Cerebrovascular atherosclerotic calcifications are noted. The osseous structures are unremarkable. CT CERVICAL SPINE FINDINGS The alignment is anatomic. The vertebral body heights are maintained. There is no acute fracture. There is no static listhesis. The prevertebral soft tissues are normal. The intraspinal soft tissues are not fully imaged on this examination due to poor soft tissue contrast, but there is no gross soft tissue abnormality. There is mild degenerative disc disease at C3-4 and C6-7. Broad central disc protrusion at C2-3. Small central disc protrusion at C3-4. The visualized portions of the lung apices demonstrate no focal abnormality. IMPRESSION: 1. No acute intracranial pathology. 2. No acute osseous injury of the cervical spine. Electronically Signed   By: Elige Ko   On: 01/31/2015 18:40   Ct Cervical Spine Wo Contrast  01/31/2015  CLINICAL DATA:  Status post fall, neck pain EXAM: CT HEAD WITHOUT  CONTRAST CT CERVICAL SPINE WITHOUT CONTRAST TECHNIQUE: Multidetector CT imaging of the head and cervical spine was performed following the standard protocol without intravenous contrast. Multiplanar CT image reconstructions of the cervical spine were also generated. COMPARISON:  05/29/2012 FINDINGS: CT HEAD FINDINGS There is no evidence of mass effect, midline shift, or extra-axial fluid collections. There is no evidence of a space-occupying lesion or intracranial hemorrhage. There is no evidence of a cortical-based area of acute infarction. There is generalized cerebral atrophy. There is periventricular white matter low attenuation likely secondary to microangiopathy. The ventricles and sulci are appropriate for the patient's age. The basal cisterns are patent. Visualized portions of the orbits are unremarkable. The visualized portions of the paranasal sinuses and mastoid air cells are unremarkable. Cerebrovascular atherosclerotic calcifications are noted. The osseous structures are unremarkable. CT CERVICAL SPINE FINDINGS The alignment is anatomic. The vertebral body heights are maintained. There is no acute fracture. There is no static listhesis. The prevertebral soft tissues are normal. The intraspinal soft tissues are not fully imaged on this examination due to poor soft tissue contrast, but there is no gross soft tissue abnormality. There is mild degenerative disc disease at C3-4 and C6-7. Broad central disc protrusion at C2-3. Small central disc protrusion at C3-4. The visualized portions of the lung apices demonstrate no focal abnormality. IMPRESSION: 1. No acute intracranial pathology. 2. No acute osseous injury of the cervical spine. Electronically Signed   By: Elige Ko   On: 01/31/2015 18:40   Dg Knee Complete 4 Views Left  01/31/2015  CLINICAL DATA:  Fall this morning. Bruising of both knees. Initial encounter. EXAM: LEFT KNEE - COMPLETE 4+ VIEW COMPARISON:  None. FINDINGS: There is no evidence of  fracture, dislocation, or joint effusion. Nonspecific subcutaneous reticulation. IMPRESSION: No acute osseous finding. Electronically Signed   By: Marnee Spring M.D.   On: 01/31/2015 18:50   Dg Knee Complete 4 Views Right  01/31/2015  CLINICAL DATA:  Fall this morning with bruising to the bilateral knees. Initial encounter. EXAM: RIGHT KNEE - COMPLETE 4+ VIEW COMPARISON:  None. FINDINGS: Nonspecific diffuse subcutaneous reticulation. There is no evidence of fracture, dislocation, or joint effusion. IMPRESSION: No acute osseous finding. Electronically Signed   By: Marnee Spring M.D.   On: 01/31/2015 18:51   Dg Humerus Right  01/31/2015  CLINICAL DATA:  Fall with right humerus pain.  Initial encounter. EXAM: RIGHT HUMERUS - 2+ VIEW COMPARISON:  None. FINDINGS: There is no evidence of fracture or subluxation. No acute soft tissue finding. IMPRESSION: Negative right humerus. Electronically Signed  By: Marnee Spring M.D.   On: 01/31/2015 18:43     Management plans discussed with the patient, family and they are in agreement.  CODE STATUS:     Code Status Orders        Start     Ordered   01/31/15 2131  Do not attempt resuscitation (DNR)   Continuous    Question Answer Comment  In the event of cardiac or respiratory ARREST Do not call a "code blue"   In the event of cardiac or respiratory ARREST Do not perform Intubation, CPR, defibrillation or ACLS   In the event of cardiac or respiratory ARREST Use medication by any route, position, wound care, and other measures to relive pain and suffering. May use oxygen, suction and manual treatment of airway obstruction as needed for comfort.      01/31/15 2130    Code Status History    Date Active Date Inactive Code Status Order ID Comments User Context   This patient has a current code status but no historical code status.      TOTAL TIME TAKING CARE OF THIS PATIENT: 40 minutes.    Avalene Sealy M.D on 02/01/2015 at 1:51 PM  Between 7am  to 6pm - Pager - [phone] After 6pm go to [email or web address] - password [email or web address]  [facility]  Office  [phone]  CC: Primary care physician; Pcp Not In System

## 2016-12-12 ENCOUNTER — Other Ambulatory Visit: Payer: Self-pay

## 2016-12-12 ENCOUNTER — Encounter: Payer: Self-pay | Admitting: Emergency Medicine

## 2016-12-12 ENCOUNTER — Emergency Department
Admission: EM | Admit: 2016-12-12 | Discharge: 2016-12-12 | Disposition: A | Discharge status: Home / Self Care | Payer: Medicare Other | Attending: Emergency Medicine | Admitting: Emergency Medicine

## 2016-12-12 ENCOUNTER — Emergency Department: Payer: Medicare Other

## 2016-12-12 DIAGNOSIS — B349 Viral infection, unspecified: Secondary | ICD-10-CM | POA: Insufficient documentation

## 2016-12-12 DIAGNOSIS — I10 Essential (primary) hypertension: Secondary | ICD-10-CM | POA: Insufficient documentation

## 2016-12-12 DIAGNOSIS — Z79899 Other long term (current) drug therapy: Secondary | ICD-10-CM | POA: Insufficient documentation

## 2016-12-12 DIAGNOSIS — E860 Dehydration: Secondary | ICD-10-CM | POA: Insufficient documentation

## 2016-12-12 DIAGNOSIS — G20 Parkinson's disease: Secondary | ICD-10-CM | POA: Diagnosis not present

## 2016-12-12 DIAGNOSIS — K5900 Constipation, unspecified: Secondary | ICD-10-CM | POA: Insufficient documentation

## 2016-12-12 DIAGNOSIS — R1013 Epigastric pain: Secondary | ICD-10-CM

## 2016-12-12 LAB — COMPREHENSIVE METABOLIC PANEL
ALT: 7 U/L — ABNORMAL LOW (ref 14–54)
ANION GAP: 9 (ref 5–15)
AST: 25 U/L (ref 15–41)
Albumin: 3.6 g/dL (ref 3.5–5.0)
Alkaline Phosphatase: 54 U/L (ref 38–126)
BUN: 13 mg/dL (ref 6–20)
CHLORIDE: 103 mmol/L (ref 101–111)
CO2: 27 mmol/L (ref 22–32)
Calcium: 10 mg/dL (ref 8.9–10.3)
Creatinine, Ser: 1.02 mg/dL — ABNORMAL HIGH (ref 0.44–1.00)
GFR calc Af Amer: 57 mL/min — ABNORMAL LOW (ref >60)
GFR, EST NON AFRICAN AMERICAN: 49 mL/min — AB (ref >60)
Glucose, Bld: 142 mg/dL — ABNORMAL HIGH (ref 65–99)
Potassium: 4.1 mmol/L (ref 3.5–5.1)
Sodium: 139 mmol/L (ref 135–145)
Total Bilirubin: 1.5 mg/dL — ABNORMAL HIGH (ref 0.3–1.2)
Total Protein: 7.3 g/dL (ref 6.5–8.1)

## 2016-12-12 LAB — CBC
HEMATOCRIT: 47.1 % — AB (ref 35.0–47.0)
HEMOGLOBIN: 15.4 g/dL (ref 12.0–16.0)
MCH: 31.8 pg (ref 26.0–34.0)
MCHC: 32.7 g/dL (ref 32.0–36.0)
MCV: 97.5 fL (ref 80.0–100.0)
Platelets: 166 10*3/uL (ref 150–440)
RBC: 4.83 MIL/uL (ref 3.80–5.20)
RDW: 15 % — ABNORMAL HIGH (ref 11.5–14.5)
WBC: 4.6 10*3/uL (ref 3.6–11.0)

## 2016-12-12 LAB — TROPONIN I: Troponin I: <0.03 ng/mL (ref <0.03)

## 2016-12-12 LAB — LIPASE, BLOOD: LIPASE: 25 U/L (ref 11–51)

## 2016-12-12 MED ORDER — SODIUM CHLORIDE 0.9 % IV BOLUS (SEPSIS)
1000.0000 mL | Freq: Once | INTRAVENOUS | Status: AC
  Administered 2016-12-12: 1000 mL via INTRAVENOUS

## 2016-12-12 MED ORDER — MORPHINE SULFATE (PF) 4 MG/ML IV SOLN
4.0000 mg | Freq: Once | INTRAVENOUS | Status: AC
  Administered 2016-12-12: 4 mg via INTRAVENOUS
  Filled 2016-12-12: qty 1

## 2016-12-12 MED ORDER — ONDANSETRON HCL 4 MG/2ML IJ SOLN
4.0000 mg | Freq: Once | INTRAMUSCULAR | Status: AC
  Administered 2016-12-12: 4 mg via INTRAVENOUS
  Filled 2016-12-12: qty 2

## 2016-12-12 MED ORDER — ONDANSETRON 4 MG PO TBDP: 4.0000 mg | ORAL_TABLET | Freq: Three times a day (TID) | ORAL | 0 refills | Status: AC | PRN

## 2016-12-12 MED ORDER — IOPAMIDOL (ISOVUE-300) INJECTION 61%
100.0000 mL | Freq: Once | INTRAVENOUS | Status: AC | PRN
  Administered 2016-12-12: 100 mL via INTRAVENOUS

## 2016-12-12 MED ORDER — BISACODYL 5 MG PO TBEC: 5.0000 mg | DELAYED_RELEASE_TABLET | Freq: Every day | ORAL | 0 refills | Status: AC | PRN

## 2016-12-12 NOTE — ED Provider Notes (Signed)
[facility] Emergency Department Provider Note  ____________________________________________   First MD Initiated Contact with Patient 12/12/16 1843     (approximate)  I have reviewed the triage vital signs and the nursing notes.   HISTORY  Chief Complaint Abdominal Pain   HPI Lydia Church is a 83 y.o. female is brought to the emergency department by her family for 2-3 days of mild to moderate diffuse abdominal "fullness" nausea and constipation.  She denies fevers or chills.  She has had difficulty keeping food down.  She has a past abdominal surgical history of abdominal hysterectomy.  She has no chest pain or shortness of breath.  Her symptoms seem to be worse when trying to eat somewhat improved when resting.  Her pain is diffuse and nonradiating.  Past Medical History:  Diagnosis Date  . Anemia   . Arthritis   . Cataract   . Depression    never been treated for depression, has felt that way since Parkinson's   . GERD (gastroesophageal reflux disease)    uses gas ex as needed  . Glaucoma   . Headache(784.0)    because sinus problems  . Hypertension   . Legally blind in right eye, as defined in [place]   . Parkinson's disease [facility](HCC)     Patient Active Problem List   Diagnosis Date Noted  . Elevated troponin 01/31/2015  . Fall 01/31/2015  . Right arm pain 01/31/2015  . HTN (hypertension) 01/31/2015  . Parkinson disease (HCC) 01/31/2015  . GERD (gastroesophageal reflux disease) 01/31/2015  . Arthritis 01/31/2015  . Generalized weakness 01/31/2015    Past Surgical History:  Procedure Laterality Date  . ABDOMINAL HYSTERECTOMY    . CATARACT EXTRACTION W/PHACO Left 09/01/2013   Procedure: CATARACT EXTRACTION PHACO AND INTRAOCULAR LENS PLACEMENT (IOC);  Surgeon: Chalmers Guestoy Whitaker, MD;  [facility];  Service: Ophthalmology;  Laterality: Left;  . COLONOSCOPY    . EYE SURGERY     fluid removed from eyes  . MITOMYCIN C APPLICATION Left 09/01/2013   Procedure: MITOMYCIN C APPLICATION;  Surgeon: Chalmers Guestoy Whitaker, MD;  [facility];  Service: Ophthalmology;  Laterality: Left;  . TRABECULECTOMY Left 09/01/2013   Procedure: TRABECULECTOMY LEFT EYE;  Surgeon: Chalmers Guestoy Whitaker, MD;  [facility];  Service: Ophthalmology;  Laterality: Left;    Prior to Admission medications   Medication Sig Start Date End Date Taking? Authorizing Provider  bisacodyl (DULCOLAX) 5 MG EC tablet Take 1 tablet (5 mg total) by mouth daily as needed for moderate constipation. 12/12/16 12/12/17  Merrily Brittleifenbark, Carlson Belland, MD  Brinzolamide-Brimonidine [facility](SIMBRINZA) 1-0.2 % SUSP Place 1 drop into the left eye 3 (three) times daily.    [provider]  carbidopa-levodopa (SINEMET IR) 25-100 MG per tablet Take 2 tablets by mouth 3 (three) times daily with meals.     [provider]  cetirizine (ZYRTEC) 10 MG tablet Take 10 mg by mouth daily.     [provider]  furosemide (LASIX) 20 MG tablet Take 20 mg by mouth daily.     [provider]  losartan (COZAAR) 50 MG tablet Take 50 mg by mouth daily.    [provider]  ondansetron (ZOFRAN ODT) 4 MG disintegrating tablet Take 1 tablet (4 mg total) by mouth every 8 (eight) hours as needed for nausea or vomiting. 12/12/16   Merrily Brittleifenbark, Sol Odor, MD    Allergies Patient has no known allergies.  Family History  Problem Relation Age of Onset  . Hypertension Mother  Social History Social History   Tobacco Use  . Smoking status: Never Smoker  . Smokeless tobacco: Never Used  Substance Use Topics  . Alcohol use: No  . Drug use: No    Review of Systems Constitutional: No fever/chills Eyes: No visual changes. ENT: No sore throat. Cardiovascular: Denies chest pain. Respiratory: Denies shortness of breath. Gastrointestinal: Positive for abdominal pain.  Positive for nausea, no vomiting.  No diarrhea.  Positive for constipation. Genitourinary: Negative for dysuria. Musculoskeletal: Negative  for back pain. Skin: Negative for rash. Neurological: Negative for headaches, focal weakness or numbness.   ____________________________________________   PHYSICAL EXAM:  VITAL SIGNS: ED Triage Vitals  Enc Vitals Group     BP 12/12/16 1701 (!) 126/96     Pulse Rate 12/12/16 1701 (!) 113     Resp 12/12/16 1701 16     Temp 12/12/16 1701 97.6 F (36.4 C)     Temp Source 12/12/16 1701 Oral     SpO2 12/12/16 1701 96 %     Weight 12/12/16 1702 205 lb (93 kg)     Height 12/12/16 1702 5\' 4"  (1.626 m)     Head Circumference --      Peak Flow --      Pain Score 12/12/16 1710 0     Pain Loc --      Pain Edu? --      Excl. in GC? --     Constitutional: Alert and oriented x4 joking laughing pleasant cooperative speaks in full clear sentences no diaphoresis Eyes: PERRL EOMI. Head: Atraumatic. Nose: No congestion/rhinnorhea. Mouth/Throat: No trismus Neck: No stridor.   Cardiovascular: Tachycardic rate, regular rhythm. Grossly normal heart sounds.  Good peripheral circulation. Respiratory: Normal respiratory effort.  No retractions. Lungs CTAB and moving good air Gastrointestinal: Soft abdomen mild diffuse upper abdominal discomfort with no focality no rebound or guarding no peritonitis negative Murphy's no costovertebral tenderness Musculoskeletal: No lower extremity edema   Neurologic:  Normal speech and language. No gross focal neurologic deficits are appreciated. Skin:  Skin is warm, dry and intact. No rash noted. Psychiatric: Mood and affect are normal. Speech and behavior are normal.    ____________________________________________   DIFFERENTIAL includes but not limited to  Appendicitis, small bowel obstruction, large bowel obstruction, volvulus, cholecystitis, mesenteric ischemia ____________________________________________   LABS (all labs ordered are listed, but only abnormal results are displayed)  Labs Reviewed  COMPREHENSIVE METABOLIC PANEL - Abnormal; Notable for  the following components:      Result Value   Glucose, Bld 142 (*)    Creatinine, Ser 1.02 (*)    ALT 7 (*)    Total Bilirubin 1.5 (*)    GFR calc non Af Amer 49 (*)    GFR calc Af Amer 57 (*)    All other components within normal limits  CBC - Abnormal; Notable for the following components:   HCT 47.1 (*)    RDW 15.0 (*)    All other components within normal limits  LIPASE, BLOOD  TROPONIN I    Blood work reviewed by me shows chronic kidney disease but no evidence of acute injury __________________________________________  EKG  ED ECG REPORT I, Merrily BrittleNeil Ildefonso Keaney, the attending physician, personally viewed and interpreted this ECG.  Date: 12/12/2016 Rate: 133 Rhythm atrial fibrillation Narrative Interpretation: Remarkable amount of artifact makes interpretation nearly impossible but no obvious ischemia noted  ____________________________________________  RADIOLOGY  CT abdomen pelvis reviewed by me with no acute disease ____________________________________________   PROCEDURES  Procedure(s) performed: no  Procedures  Critical Care performed: no  Observation: no ____________________________________________   INITIAL IMPRESSION / ASSESSMENT AND PLAN / ED COURSE  Pertinent labs & imaging results that were available during my care of the patient were reviewed by me and considered in my medical decision making (see chart for details).  The patient arrives tachycardic although very well-appearing.  She does have some upper abdominal discomfort and tenderness.  Differential in 24 an 83 year old with abdominal pain tenderness and nausea is extremely broad and she requires a CT scan for further evaluation.      __----------------------------------------- 7:43 PM on 12/12/2016 -----------------------------------------  The patient feels improved and she is able to keep some food down.  Her primary concern is her constipation and nausea.  We will treat her  symptomatically and refer her back to primary care.  The patient verbalizes understanding and agreement the plan.  Strict return precautions have been given.  __________________________________________   FINAL CLINICAL IMPRESSION(S) / ED DIAGNOSES  Final diagnoses:  Epigastric pain  Constipation, unspecified constipation type  Dehydration  Viral syndrome      NEW MEDICATIONS STARTED DURING THIS VISIT:  This SmartLink is deprecated. Use AVSMEDLIST instead to display the medication list for a patient.   Note:  This document was prepared using Dragon voice recognition software and may include unintentional dictation errors.     Merrily Brittle, MD 12/13/16 1155

## 2016-12-12 NOTE — ED Triage Notes (Signed)
Pt reports here for "full" feeling in abdomen. Feels like when has gas sometimes.  Has nausea no vomiting. She denies abdominal pain but points to epigastric area when asked about the feeling she is having.  Pt states "I feel like I can't get my words out".  Asked how long having difficulty finding words. Pt reports feels choked and cannot get words out because not can't find words. Pt is not great historian and difficult to get true picture of problems today.  Admits to feeling short of breath "sometimes".  C/o cough since yesterday. Has headache sometimes.  Had bowel movement this AM

## 2016-12-12 NOTE — ED Notes (Signed)
Pt states upper middle abd pain and points to epigastric area. Pt states nausea but denies vomiting. States BM today but states that she still feels constipated. States had BM yesterday as well. Pt has parkinson's, shakes at baseline. Pt is alert and oriented, speaking in complete sentences, no distress noted.

## 2016-12-12 NOTE — ED Notes (Signed)
Pt taken to CT via stretcher. Family at bedside.  

## 2016-12-12 NOTE — Discharge Instructions (Signed)
Please make sure you remain well-hydrated and follow-up with your primary care physician this coming Monday for reevaluation.  Return to the emergency department sooner for any new or worsening symptoms such as fevers, chills, if you cannot eat or drink, or for any other concerns whatsoever.  It was a pleasure to take care of you today, and thank you for coming to our emergency department.  If you have any questions or concerns before leaving please ask the nurse to grab me and I'm more than happy to go through your aftercare instructions again.  If you were prescribed any opioid pain medication today such as Norco, Vicodin, Percocet, morphine, hydrocodone, or oxycodone please make sure you do not drive when you are taking this medication as it can alter your ability to drive safely.  If you have any concerns once you are home that you are not improving or are in fact getting worse before you can make it to your follow-up appointment, please do not hesitate to call [phone] and come back for further evaluation.  Merrily BrittleNeil Matraca Hunkins, MD  Results for orders placed or performed during the hospital encounter of 12/12/16  Lipase, blood  Result Value Ref Range   Lipase 25 11 - 51 U/L  Comprehensive metabolic panel  Result Value Ref Range   Sodium 139 135 - 145 mmol/L   Potassium 4.1 3.5 - 5.1 mmol/L   Chloride 103 101 - 111 mmol/L   CO2 27 22 - 32 mmol/L   Glucose, Bld 142 (H) 65 - 99 mg/dL   BUN 13 6 - 20 mg/dL   Creatinine, Ser 6.041.02 (H) 0.44 - 1.00 mg/dL   Calcium 54.010.0 8.9 - 98.110.3 mg/dL   Total Protein 7.3 6.5 - 8.1 g/dL   Albumin 3.6 3.5 - 5.0 g/dL   AST 25 15 - 41 U/L   ALT 7 (L) 14 - 54 U/L   Alkaline Phosphatase 54 38 - 126 U/L   Total Bilirubin 1.5 (H) 0.3 - 1.2 mg/dL   GFR calc non Af Amer 49 (L) >60 mL/min   GFR calc Af Amer 57 (L) >60 mL/min   Anion gap 9 5 - 15  CBC  Result Value Ref Range   WBC 4.6 3.6 - 11.0 K/uL   RBC 4.83 3.80 - 5.20 MIL/uL   Hemoglobin 15.4 12.0 - 16.0 g/dL   HCT  19.147.1 (H) 47.835.0 - 47.0 %   MCV 97.5 80.0 - 100.0 fL   MCH 31.8 26.0 - 34.0 pg   MCHC 32.7 32.0 - 36.0 g/dL   RDW 29.515.0 (H) 62.111.5 - 30.814.5 %   Platelets 166 150 - 440 K/uL  Troponin I  Result Value Ref Range   Troponin I <0.03 <0.03 ng/mL   Dg Chest 2 View  Result Date: 12/12/2016 CLINICAL DATA:  Cough and shortness of breath. EXAM: CHEST  2 VIEW COMPARISON:  Chest x-ray dated September 01, 2013. FINDINGS: Mild cardiomegaly, unchanged. Persistent mild pulmonary vascular congestion. Atherosclerotic calcification of the aortic arch. No focal consolidation, pleural effusion, or pneumothorax. No acute osseous abnormality. IMPRESSION: Stable cardiomegaly and chronic pulmonary vascular congestion. No definite acute superimposed process. Electronically Signed   By: Obie DredgeWilliam T Derry M.D.   On: 12/12/2016 18:03   Ct Abdomen Pelvis W Contrast  Result Date: 12/12/2016 CLINICAL DATA:  Pt states upper middle abd pain and points to epigastric area. Pt states nausea but denies vomiting. States BM today but states that she still feels constipated. States had BM yesterday as well.  Pt has parkinson's, shakes at baseline. EXAM: CT ABDOMEN AND PELVIS WITH CONTRAST TECHNIQUE: Multidetector CT imaging of the abdomen and pelvis was performed using the standard protocol following bolus administration of intravenous contrast. CONTRAST:  100mL ISOVUE-300 IOPAMIDOL (ISOVUE-300) INJECTION 61% COMPARISON:  05/31/2012 FINDINGS: Lower chest: No acute findings.  Heart is enlarged. Hepatobiliary: No focal liver abnormality is seen. No gallstones, gallbladder wall thickening, or biliary dilatation. Pancreas: Unremarkable. No pancreatic ductal dilatation or surrounding inflammatory changes. Spleen: Normal in size without focal abnormality. Adrenals/Urinary Tract: No adrenal masses. Small nonobstructing stone in the upper pole the right kidney with a tiny nonobstructing stone in the lower pole of the left kidney. Renal sinus cysts are noted  along the medial aspect of the mid to lower pole the left kidney. No other renal masses. There is no hydronephrosis. Ureters are normal in course and in caliber. Bladder is unremarkable. Stomach/Bowel: Stomach and small bowel unremarkable. There are numerous colonic diverticula. No diverticulitis or other colon inflammatory process. Normal appendix visualized. Vascular/Lymphatic: Aortic atherosclerosis. No enlarged abdominal or pelvic lymph nodes. Reproductive: Status post hysterectomy. No adnexal masses. Other: No ascites. Musculoskeletal: No fracture or acute finding. No osteoblastic or osteolytic lesions. IMPRESSION: 1. No acute findings. 2. Small nonobstructing stones in each kidney. Left renal sinus cysts, stable from the prior CT. 3. Colonic diverticulosis without evidence of diverticulitis. 4. Cardiomegaly. 5. Mild aortic atherosclerosis. Electronically Signed   By: Amie Portlandavid  Ormond M.D.   On: 12/12/2016 19:30

## 2017-04-19 ENCOUNTER — Encounter: Payer: Self-pay | Admitting: Emergency Medicine

## 2017-04-19 ENCOUNTER — Inpatient Hospital Stay
Admission: EM | Admit: 2017-04-19 | Discharge: 2017-04-22 | DRG: 293 | Disposition: A | Discharge status: Home Health | Payer: Medicare Other | Attending: Internal Medicine | Admitting: Internal Medicine

## 2017-04-19 ENCOUNTER — Other Ambulatory Visit: Payer: Self-pay

## 2017-04-19 ENCOUNTER — Emergency Department: Payer: Medicare Other

## 2017-04-19 DIAGNOSIS — Z66 Do not resuscitate: Secondary | ICD-10-CM | POA: Diagnosis present

## 2017-04-19 DIAGNOSIS — I4891 Unspecified atrial fibrillation: Secondary | ICD-10-CM

## 2017-04-19 DIAGNOSIS — Z9842 Cataract extraction status, left eye: Secondary | ICD-10-CM

## 2017-04-19 DIAGNOSIS — I110 Hypertensive heart disease with heart failure: Secondary | ICD-10-CM | POA: Diagnosis not present

## 2017-04-19 DIAGNOSIS — Z7951 Long term (current) use of inhaled steroids: Secondary | ICD-10-CM

## 2017-04-19 DIAGNOSIS — G20 Parkinson's disease: Secondary | ICD-10-CM | POA: Diagnosis present

## 2017-04-19 DIAGNOSIS — Z961 Presence of intraocular lens: Secondary | ICD-10-CM | POA: Diagnosis present

## 2017-04-19 DIAGNOSIS — Z8249 Family history of ischemic heart disease and other diseases of the circulatory system: Secondary | ICD-10-CM

## 2017-04-19 DIAGNOSIS — R600 Localized edema: Secondary | ICD-10-CM

## 2017-04-19 DIAGNOSIS — H5461 Unqualified visual loss, right eye, normal vision left eye: Secondary | ICD-10-CM | POA: Diagnosis present

## 2017-04-19 DIAGNOSIS — I5043 Acute on chronic combined systolic (congestive) and diastolic (congestive) heart failure: Secondary | ICD-10-CM | POA: Diagnosis present

## 2017-04-19 DIAGNOSIS — K219 Gastro-esophageal reflux disease without esophagitis: Secondary | ICD-10-CM | POA: Diagnosis present

## 2017-04-19 DIAGNOSIS — I480 Paroxysmal atrial fibrillation: Secondary | ICD-10-CM | POA: Diagnosis present

## 2017-04-19 DIAGNOSIS — R0602 Shortness of breath: Secondary | ICD-10-CM

## 2017-04-19 DIAGNOSIS — Z90710 Acquired absence of both cervix and uterus: Secondary | ICD-10-CM

## 2017-04-19 DIAGNOSIS — J810 Acute pulmonary edema: Secondary | ICD-10-CM

## 2017-04-19 DIAGNOSIS — I482 Chronic atrial fibrillation: Secondary | ICD-10-CM | POA: Diagnosis present

## 2017-04-19 DIAGNOSIS — H409 Unspecified glaucoma: Secondary | ICD-10-CM | POA: Diagnosis present

## 2017-04-19 LAB — COMPREHENSIVE METABOLIC PANEL
ALK PHOS: 61 U/L (ref 38–126)
ALT: 19 U/L (ref 14–54)
ANION GAP: 10 (ref 5–15)
AST: 24 U/L (ref 15–41)
Albumin: 4.1 g/dL (ref 3.5–5.0)
BILIRUBIN TOTAL: 0.9 mg/dL (ref 0.3–1.2)
BUN: 15 mg/dL (ref 6–20)
CALCIUM: 9.9 mg/dL (ref 8.9–10.3)
CO2: 28 mmol/L (ref 22–32)
Chloride: 105 mmol/L (ref 101–111)
Creatinine, Ser: 0.95 mg/dL (ref 0.44–1.00)
GFR, EST NON AFRICAN AMERICAN: 53 mL/min — AB (ref >60)
Glucose, Bld: 116 mg/dL — ABNORMAL HIGH (ref 65–99)
POTASSIUM: 3.7 mmol/L (ref 3.5–5.1)
Sodium: 143 mmol/L (ref 135–145)
TOTAL PROTEIN: 7.5 g/dL (ref 6.5–8.1)

## 2017-04-19 LAB — CBC
HEMATOCRIT: 46.5 % (ref 35.0–47.0)
Hemoglobin: 15.1 g/dL (ref 12.0–16.0)
MCH: 31.6 pg (ref 26.0–34.0)
MCHC: 32.6 g/dL (ref 32.0–36.0)
MCV: 96.9 fL (ref 80.0–100.0)
PLATELETS: 176 10*3/uL (ref 150–440)
RBC: 4.79 MIL/uL (ref 3.80–5.20)
RDW: 15.4 % — AB (ref 11.5–14.5)
WBC: 4.6 10*3/uL (ref 3.6–11.0)

## 2017-04-19 LAB — TROPONIN I: Troponin I: <0.03 ng/mL (ref <0.03)

## 2017-04-19 LAB — BRAIN NATRIURETIC PEPTIDE: B NATRIURETIC PEPTIDE 5: 251 pg/mL — AB (ref 0.0–100.0)

## 2017-04-19 MED ORDER — FUROSEMIDE 10 MG/ML IJ SOLN
20.0000 mg | Freq: Once | INTRAMUSCULAR | Status: AC
  Administered 2017-04-20: 20 mg via INTRAVENOUS
  Filled 2017-04-19: qty 4

## 2017-04-19 NOTE — ED Provider Notes (Signed)
[facility] Emergency Department Provider Note  ____________________________________________   First MD Initiated Contact with Patient 04/19/17 2347     (approximate)  I have reviewed the triage vital signs and the nursing notes.   HISTORY  Chief Complaint Dizziness and Leg Swelling   HPI Lydia Church is a 84 y.o. female who self presents the emergency department with family with lightheadedness palpitations and leg swelling for the past 2 weeks or so.  She reports both of her legs have been swelling up and she has had more difficulty lying flat.  She has not passed out but she has felt intermittent palpitations and generally "lightheaded".  She denies chest pain.  She has a past medical history of intermittent atrial fibrillation as well as congestive heart failure.  Her symptoms have been insidious onset slowly progressive are now mild to moderate severity.  Nothing in particular seems to make them better or worse.  Past Medical History:  Diagnosis Date  . Anemia   . Arthritis   . Cataract   . Depression    never been treated for depression, has felt that way since Parkinson's   . GERD (gastroesophageal reflux disease)    uses gas ex as needed  . Glaucoma   . Headache(784.0)    because sinus problems  . Hypertension   . Legally blind in right eye, as defined in [place]   . Parkinson's disease [facility])     Patient Active Problem List   Diagnosis Date Noted  . A-fib (HCC) 04/20/2017  . Elevated troponin 01/31/2015  . Fall 01/31/2015  . Right arm pain 01/31/2015  . HTN (hypertension) 01/31/2015  . Parkinson disease (HCC) 01/31/2015  . GERD (gastroesophageal reflux disease) 01/31/2015  . Arthritis 01/31/2015  . Generalized weakness 01/31/2015    Past Surgical History:  Procedure Laterality Date  . ABDOMINAL HYSTERECTOMY    . CATARACT EXTRACTION W/PHACO Left 09/01/2013   Procedure: CATARACT EXTRACTION PHACO AND INTRAOCULAR LENS PLACEMENT (IOC);   Surgeon: Chalmers Guest, MD;  Location: [facility] OR;  Service: Ophthalmology;  Laterality: Left;  . COLONOSCOPY    . EYE SURGERY     fluid removed from eyes  . MITOMYCIN C APPLICATION Left 09/01/2013   Procedure: MITOMYCIN C APPLICATION;  Surgeon: Chalmers Guest, MD;  Location: [facility] OR;  Service: Ophthalmology;  Laterality: Left;  . TRABECULECTOMY Left 09/01/2013   Procedure: TRABECULECTOMY LEFT EYE;  Surgeon: Chalmers Guest, MD;  Location: [facility] OR;  Service: Ophthalmology;  Laterality: Left;    Prior to Admission medications   Medication Sig Start Date End Date Taking? Authorizing Provider  bisacodyl (DULCOLAX) 5 MG EC tablet Take 1 tablet (5 mg total) by mouth daily as needed for moderate constipation. 12/12/16 12/12/17 Yes Merrily Brittle, MD  Brinzolamide-Brimonidine [facility]) 1-0.2 % SUSP Place 1 drop into the left eye 3 (three) times daily.   Yes [provider]  cetirizine (ZYRTEC) 10 MG tablet Take 10 mg by mouth daily as needed (congestion).    Yes [provider]  fluticasone (FLONASE) 50 MCG/ACT nasal spray Place 2 sprays into both nostrils daily.   Yes [provider]  furosemide (LASIX) 20 MG tablet Take 20 mg by mouth daily.    Yes [provider]  LUMIGAN 0.01 % SOLN Place 1 drop into both eyes every evening. 04/17/17  Yes [provider]  ondansetron (ZOFRAN ODT) 4 MG disintegrating tablet Take 1 tablet (4 mg total) by mouth every 8 (eight) hours as needed for nausea  or vomiting. 12/12/16  Yes Merrily Brittleifenbark, Melisse Caetano, MD  rOPINIRole (REQUIP) 1 MG tablet Take 1 mg by mouth 3 (three) times daily.   Yes [provider]  VENTOLIN HFA 108 (90 Base) MCG/ACT inhaler Inhale 2 puffs into the lungs every 4 (four) hours as needed. 03/10/17  Yes [provider]  vitamin B-12 (CYANOCOBALAMIN) 1000 MCG tablet Take 1,000 mcg by mouth daily.   Yes [provider]  carbidopa-levodopa (SINEMET IR) 25-100 MG per tablet Take 2 tablets by mouth 3 (three)  times daily with meals.     [provider]  Cholecalciferol (VITAMIN D-1000 MAX ST) 1000 units tablet Take 1 tablet by mouth daily.    [provider]  losartan (COZAAR) 50 MG tablet Take 50 mg by mouth daily.    [provider]    Allergies Patient has no known allergies.  Family History  Problem Relation Age of Onset  . Hypertension Mother     Social History Social History   Tobacco Use  . Smoking status: Never Smoker  . Smokeless tobacco: Never Used  Substance Use Topics  . Alcohol use: No  . Drug use: No    Review of Systems Constitutional: No fever/chills Eyes: No visual changes. ENT: No sore throat. Cardiovascular: Denies chest pain. Respiratory: Denies shortness of breath. Gastrointestinal: No abdominal pain.  No nausea, no vomiting.  No diarrhea.  No constipation. Genitourinary: Negative for dysuria. Musculoskeletal: Negative for back pain. Skin: Negative for rash. Neurological: Negative for headaches, focal weakness or numbness.   ____________________________________________   PHYSICAL EXAM:  VITAL SIGNS: ED Triage Vitals  Enc Vitals Group     BP 04/19/17 1843 (!) 152/93     Pulse Rate 04/19/17 1843 88     Resp 04/19/17 1843 20     Temp 04/19/17 1843 97.7 F (36.5 C)     Temp Source 04/19/17 1843 Oral     SpO2 04/19/17 1843 98 %     Weight 04/19/17 1844 190 lb (86.2 kg)     Height 04/19/17 1844 5\' 4"  (1.626 m)     Head Circumference --      Peak Flow --      Pain Score 04/19/17 1844 0     Pain Loc --      Pain Edu? --      Excl. in GC? --     Constitutional: Alert and oriented x4 pleasant cooperative speaks in full clear sentences no diaphoresis Head: Atraumatic. Nose: No congestion/rhinnorhea. Mouth/Throat: No trismus Neck: No stridor.  Unable to quite lie completely flat Cardiovascular: Irregularly irregular and tachycardic Respiratory: Slightly increased respiratory effort with crackles at bilateral  bases Gastrointestinal: Soft nontender Musculoskeletal: Legs are equal in size 2+ pitting edema bilaterally Neurologic:  Normal speech and language. No gross focal neurologic deficits are appreciated. Skin:  Skin is warm, dry and intact. No rash noted. Psychiatric: Mood and affect are normal. Speech and behavior are normal.    ____________________________________________   DIFFERENTIAL includes but not limited to  Atrial fibrillation, congestive heart failure, pulmonary edema, dehydration ____________________________________________   LABS (all labs ordered are listed, but only abnormal results are displayed)  Labs Reviewed  CBC - Abnormal; Notable for the following components:      Result Value   RDW 15.4 (*)    All other components within normal limits  COMPREHENSIVE METABOLIC PANEL - Abnormal; Notable for the following components:   Glucose, Bld 116 (*)    GFR calc non Af Amer 53 (*)  All other components within normal limits  BRAIN NATRIURETIC PEPTIDE - Abnormal; Notable for the following components:   B Natriuretic Peptide 251.0 (*)    All other components within normal limits  CBC - Abnormal; Notable for the following components:   Hemoglobin 16.3 (*)    HCT 49.5 (*)    RDW 15.3 (*)    All other components within normal limits  TROPONIN I - Abnormal; Notable for the following components:   Troponin I 0.03 (*)    All other components within normal limits  BASIC METABOLIC PANEL - Abnormal; Notable for the following components:   Glucose, Bld 161 (*)    All other components within normal limits  GLUCOSE, CAPILLARY - Abnormal; Notable for the following components:   Glucose-Capillary 162 (*)    All other components within normal limits  TROPONIN I  TROPONIN I  TROPONIN I  TSH    Lab work reviewed by me with slight elevation in troponin and BNP likely secondary to some mild fluid overload __________________________________________  EKG  ED ECG REPORT I,  Merrily Brittle, the attending physician, personally viewed and interpreted this ECG.  Date: 04/19/2017 EKG Time:  Rate: 97 QRS Axis: Leftward axis ST/T Wave abnormalities: normal Narrative Interpretation: Significant amount of artifact makes interpretation extremely difficult.  The patient has a narrow complex rhythm that is irregular with no obvious signs of ischemia  ____________________________________________  RADIOLOGY  Chest x-ray reviewed by me with no acute disease ____________________________________________   PROCEDURES  Procedure(s) performed: no  .Critical Care Performed by: Merrily Brittle, MD Authorized by: Merrily Brittle, MD   Critical care provider statement:    Critical care time (minutes):  30   Critical care time was exclusive of:  Separately billable procedures and treating other patients   Critical care was necessary to treat or prevent imminent or life-threatening deterioration of the following conditions:  Cardiac failure   Critical care was time spent personally by me on the following activities:  Development of treatment plan with patient or surrogate, discussions with consultants, evaluation of patient's response to treatment, examination of patient, obtaining history from patient or surrogate, ordering and performing treatments and interventions, ordering and review of laboratory studies, ordering and review of radiographic studies, pulse oximetry, re-evaluation of patient's condition and review of old charts    Critical Care performed: Yes  Observation: no ____________________________________________   INITIAL IMPRESSION / ASSESSMENT AND PLAN / ED COURSE  Pertinent labs & imaging results that were available during my care of the patient were reviewed by me and considered in my medical decision making (see chart for details).  The patient arrives clearly somewhat fluid overloaded and in atrial fibrillation with rapid ventricular response.  She  is not currently taking any nodal blockers.  Given a first dose of Lasix here and 4 g of magnesium which did improve her heart rate somewhat.  She does remain quite tachycardic to the 130s however so given a first IV dose of metoprolol.  At this point as the patient is both fluid overloaded with pulmonary edema and atrial fibrillation with rapid ventricular response I do believe she requires inpatient admission for further titration of her cardiac medications and for IV diuresis.  I discussed with the family and the patient who verbalized understanding and agreement with the plan.  I then discussed with the hospitalist who has graciously agreed to admit the patient to his service.      ____________________________________________   FINAL CLINICAL IMPRESSION(S) / ED DIAGNOSES  Final diagnoses:  Atrial fibrillation with RVR (HCC)  Acute pulmonary edema (HCC)      NEW MEDICATIONS STARTED DURING THIS VISIT:  Current Discharge Medication List       Note:  This document was prepared using Dragon voice recognition software and may include unintentional dictation errors.     Merrily Brittle, MD 04/21/17 [phone]

## 2017-04-19 NOTE — ED Triage Notes (Signed)
dizzyness and leg edema x 2 weeks. Denies chest pain, intermittent SOB. Patient is blind. Lives at home with help of children around the clock.

## 2017-04-19 NOTE — ED Triage Notes (Signed)
First Nurse Note:  Arrives c/o dizziness x 2 weeks.  Patient is AAOx3.  Skin warm and dry.  NAD

## 2017-04-20 ENCOUNTER — Inpatient Hospital Stay
Admit: 2017-04-20 | Discharge: 2017-04-20 | Disposition: A | Discharge status: Home / Self Care | Payer: Medicare Other | Attending: Family Medicine | Admitting: Family Medicine

## 2017-04-20 ENCOUNTER — Other Ambulatory Visit: Payer: Self-pay

## 2017-04-20 DIAGNOSIS — I110 Hypertensive heart disease with heart failure: Secondary | ICD-10-CM | POA: Diagnosis present

## 2017-04-20 DIAGNOSIS — Z66 Do not resuscitate: Secondary | ICD-10-CM | POA: Diagnosis present

## 2017-04-20 DIAGNOSIS — Z9842 Cataract extraction status, left eye: Secondary | ICD-10-CM | POA: Diagnosis not present

## 2017-04-20 DIAGNOSIS — Z8249 Family history of ischemic heart disease and other diseases of the circulatory system: Secondary | ICD-10-CM | POA: Diagnosis not present

## 2017-04-20 DIAGNOSIS — I4891 Unspecified atrial fibrillation: Secondary | ICD-10-CM | POA: Diagnosis present

## 2017-04-20 DIAGNOSIS — K219 Gastro-esophageal reflux disease without esophagitis: Secondary | ICD-10-CM | POA: Diagnosis present

## 2017-04-20 DIAGNOSIS — Z961 Presence of intraocular lens: Secondary | ICD-10-CM | POA: Diagnosis present

## 2017-04-20 DIAGNOSIS — H5461 Unqualified visual loss, right eye, normal vision left eye: Secondary | ICD-10-CM | POA: Diagnosis present

## 2017-04-20 DIAGNOSIS — Z7951 Long term (current) use of inhaled steroids: Secondary | ICD-10-CM | POA: Diagnosis not present

## 2017-04-20 DIAGNOSIS — J810 Acute pulmonary edema: Secondary | ICD-10-CM | POA: Diagnosis present

## 2017-04-20 DIAGNOSIS — H409 Unspecified glaucoma: Secondary | ICD-10-CM | POA: Diagnosis present

## 2017-04-20 DIAGNOSIS — I480 Paroxysmal atrial fibrillation: Secondary | ICD-10-CM | POA: Diagnosis present

## 2017-04-20 DIAGNOSIS — I482 Chronic atrial fibrillation: Secondary | ICD-10-CM | POA: Diagnosis present

## 2017-04-20 DIAGNOSIS — Z90710 Acquired absence of both cervix and uterus: Secondary | ICD-10-CM | POA: Diagnosis not present

## 2017-04-20 DIAGNOSIS — G20 Parkinson's disease: Secondary | ICD-10-CM | POA: Diagnosis present

## 2017-04-20 DIAGNOSIS — I5043 Acute on chronic combined systolic (congestive) and diastolic (congestive) heart failure: Secondary | ICD-10-CM | POA: Diagnosis present

## 2017-04-20 LAB — CBC
HCT: 49.5 % — ABNORMAL HIGH (ref 35.0–47.0)
HEMOGLOBIN: 16.3 g/dL — AB (ref 12.0–16.0)
MCH: 31.6 pg (ref 26.0–34.0)
MCHC: 32.9 g/dL (ref 32.0–36.0)
MCV: 96.2 fL (ref 80.0–100.0)
Platelets: 178 10*3/uL (ref 150–440)
RBC: 5.15 MIL/uL (ref 3.80–5.20)
RDW: 15.3 % — ABNORMAL HIGH (ref 11.5–14.5)
WBC: 4.4 10*3/uL (ref 3.6–11.0)

## 2017-04-20 LAB — BASIC METABOLIC PANEL
Anion gap: 11 (ref 5–15)
BUN: 12 mg/dL (ref 6–20)
CALCIUM: 9.5 mg/dL (ref 8.9–10.3)
CHLORIDE: 102 mmol/L (ref 101–111)
CO2: 26 mmol/L (ref 22–32)
CREATININE: 0.8 mg/dL (ref 0.44–1.00)
GFR calc non Af Amer: >60 mL/min (ref >60)
Glucose, Bld: 161 mg/dL — ABNORMAL HIGH (ref 65–99)
Potassium: 3.6 mmol/L (ref 3.5–5.1)
SODIUM: 139 mmol/L (ref 135–145)

## 2017-04-20 LAB — TSH: TSH: 3.408 u[IU]/mL (ref 0.350–4.500)

## 2017-04-20 LAB — TROPONIN I: TROPONIN I: 0.03 ng/mL — AB (ref <0.03)

## 2017-04-20 LAB — ECHOCARDIOGRAM COMPLETE
HEIGHTINCHES: 64 in
Weight: 3480 oz

## 2017-04-20 LAB — GLUCOSE, CAPILLARY: Glucose-Capillary: 162 mg/dL — ABNORMAL HIGH (ref 65–99)

## 2017-04-20 MED ORDER — PANTOPRAZOLE SODIUM 40 MG IV SOLR
40.0000 mg | INTRAVENOUS | Status: DC
  Administered 2017-04-20 – 2017-04-22 (×3): 40 mg via INTRAVENOUS
  Filled 2017-04-20 (×3): qty 40

## 2017-04-20 MED ORDER — ACETAMINOPHEN 650 MG RE SUPP: 650.0000 mg | Freq: Four times a day (QID) | RECTAL | Status: DC | PRN

## 2017-04-20 MED ORDER — MAGNESIUM SULFATE 4 GM/100ML IV SOLN
4.0000 g | Freq: Once | INTRAVENOUS | Status: AC
  Administered 2017-04-20: 4 g via INTRAVENOUS
  Filled 2017-04-20: qty 100

## 2017-04-20 MED ORDER — SODIUM CHLORIDE 0.9% FLUSH: 3.0000 mL | INTRAVENOUS | Status: DC | PRN

## 2017-04-20 MED ORDER — CARBIDOPA-LEVODOPA 25-100 MG PO TABS
2.0000 | ORAL_TABLET | Freq: Three times a day (TID) | ORAL | Status: DC
  Administered 2017-04-20 – 2017-04-22 (×7): 2 via ORAL
  Filled 2017-04-20 (×8): qty 2

## 2017-04-20 MED ORDER — ACETAMINOPHEN 325 MG PO TABS: 650.0000 mg | ORAL_TABLET | Freq: Four times a day (QID) | ORAL | Status: DC | PRN

## 2017-04-20 MED ORDER — VITAMIN B-12 1000 MCG PO TABS
1000.0000 ug | ORAL_TABLET | Freq: Every day | ORAL | Status: DC
  Administered 2017-04-21 – 2017-04-22 (×2): 1000 ug via ORAL
  Filled 2017-04-20 (×3): qty 1

## 2017-04-20 MED ORDER — ONDANSETRON HCL 4 MG/2ML IJ SOLN
4.0000 mg | Freq: Four times a day (QID) | INTRAMUSCULAR | Status: DC | PRN
  Administered 2017-04-20 (×2): 4 mg via INTRAVENOUS
  Filled 2017-04-20 (×2): qty 2

## 2017-04-20 MED ORDER — SODIUM CHLORIDE 0.9% FLUSH
3.0000 mL | Freq: Two times a day (BID) | INTRAVENOUS | Status: DC
  Administered 2017-04-20 – 2017-04-21 (×4): 3 mL via INTRAVENOUS

## 2017-04-20 MED ORDER — ALBUTEROL SULFATE (2.5 MG/3ML) 0.083% IN NEBU: 2.5000 mg | INHALATION_SOLUTION | RESPIRATORY_TRACT | Status: DC | PRN

## 2017-04-20 MED ORDER — HYDROCODONE-ACETAMINOPHEN 5-325 MG PO TABS: 1.0000 | ORAL_TABLET | ORAL | Status: DC | PRN

## 2017-04-20 MED ORDER — CARVEDILOL 3.125 MG PO TABS
3.1250 mg | ORAL_TABLET | Freq: Two times a day (BID) | ORAL | Status: DC
  Administered 2017-04-21 (×2): 3.125 mg via ORAL
  Filled 2017-04-20 (×4): qty 1

## 2017-04-20 MED ORDER — PNEUMOCOCCAL VAC POLYVALENT 25 MCG/0.5ML IJ INJ
0.5000 mL | INJECTION | INTRAMUSCULAR | Status: DC
  Filled 2017-04-20: qty 0.5

## 2017-04-20 MED ORDER — BISACODYL 5 MG PO TBEC
5.0000 mg | DELAYED_RELEASE_TABLET | Freq: Every day | ORAL | Status: DC | PRN
  Administered 2017-04-21: 5 mg via ORAL
  Filled 2017-04-20: qty 1

## 2017-04-20 MED ORDER — LOSARTAN POTASSIUM 50 MG PO TABS
50.0000 mg | ORAL_TABLET | Freq: Every day | ORAL | Status: DC
  Filled 2017-04-20: qty 1

## 2017-04-20 MED ORDER — SODIUM CHLORIDE 0.9 % IV SOLN: 250.0000 mL | INTRAVENOUS | Status: DC | PRN

## 2017-04-20 MED ORDER — FLUTICASONE PROPIONATE 50 MCG/ACT NA SUSP
2.0000 | Freq: Every day | NASAL | Status: DC
  Filled 2017-04-20: qty 16

## 2017-04-20 MED ORDER — METOPROLOL TARTRATE 5 MG/5ML IV SOLN
2.5000 mg | Freq: Once | INTRAVENOUS | Status: AC
  Administered 2017-04-20: 2.5 mg via INTRAVENOUS
  Filled 2017-04-20: qty 5

## 2017-04-20 MED ORDER — LORATADINE 10 MG PO TABS
10.0000 mg | ORAL_TABLET | Freq: Every day | ORAL | Status: DC
  Filled 2017-04-20: qty 1

## 2017-04-20 MED ORDER — DOCUSATE SODIUM 100 MG PO CAPS
100.0000 mg | ORAL_CAPSULE | Freq: Two times a day (BID) | ORAL | Status: DC
  Administered 2017-04-21 – 2017-04-22 (×3): 100 mg via ORAL
  Filled 2017-04-20 (×5): qty 1

## 2017-04-20 MED ORDER — BISACODYL 10 MG RE SUPP
10.0000 mg | Freq: Every day | RECTAL | Status: DC | PRN
  Administered 2017-04-22: 10 mg via RECTAL
  Filled 2017-04-20: qty 1

## 2017-04-20 MED ORDER — DILTIAZEM HCL 100 MG IV SOLR
5.0000 mg/h | INTRAVENOUS | Status: DC
  Filled 2017-04-20: qty 100

## 2017-04-20 MED ORDER — FUROSEMIDE 10 MG/ML IJ SOLN
20.0000 mg | Freq: Two times a day (BID) | INTRAMUSCULAR | Status: DC
  Administered 2017-04-20 – 2017-04-21 (×2): 20 mg via INTRAVENOUS
  Filled 2017-04-20 (×2): qty 2

## 2017-04-20 MED ORDER — ROPINIROLE HCL 1 MG PO TABS
1.0000 mg | ORAL_TABLET | Freq: Three times a day (TID) | ORAL | Status: DC
  Administered 2017-04-20 – 2017-04-22 (×3): 1 mg via ORAL
  Filled 2017-04-20 (×6): qty 1

## 2017-04-20 MED ORDER — MIDODRINE HCL 5 MG PO TABS
5.0000 mg | ORAL_TABLET | Freq: Three times a day (TID) | ORAL | Status: DC
  Administered 2017-04-20 – 2017-04-22 (×5): 5 mg via ORAL
  Filled 2017-04-20 (×5): qty 1

## 2017-04-20 MED ORDER — POLYETHYLENE GLYCOL 3350 17 G PO PACK: 17.0000 g | PACK | Freq: Every day | ORAL | Status: DC | PRN

## 2017-04-20 MED ORDER — LATANOPROST 0.005 % OP SOLN
1.0000 [drp] | Freq: Every day | OPHTHALMIC | Status: DC
  Administered 2017-04-21: 1 [drp] via OPHTHALMIC
  Filled 2017-04-20 (×2): qty 2.5

## 2017-04-20 MED ORDER — SODIUM CHLORIDE 0.9% FLUSH
3.0000 mL | Freq: Two times a day (BID) | INTRAVENOUS | Status: DC
  Administered 2017-04-21 (×2): 3 mL via INTRAVENOUS

## 2017-04-20 MED ORDER — ONDANSETRON HCL 4 MG PO TABS: 4.0000 mg | ORAL_TABLET | Freq: Four times a day (QID) | ORAL | Status: DC | PRN

## 2017-04-20 MED ORDER — HEPARIN SODIUM (PORCINE) 5000 UNIT/ML IJ SOLN
5000.0000 [IU] | Freq: Three times a day (TID) | INTRAMUSCULAR | Status: DC
  Administered 2017-04-20 – 2017-04-22 (×7): 5000 [IU] via SUBCUTANEOUS
  Filled 2017-04-20 (×7): qty 1

## 2017-04-20 MED ORDER — VITAMIN D 1000 UNITS PO TABS
1000.0000 [IU] | ORAL_TABLET | Freq: Every day | ORAL | Status: DC
  Administered 2017-04-21 – 2017-04-22 (×2): 1000 [IU] via ORAL
  Filled 2017-04-20 (×3): qty 1

## 2017-04-20 NOTE — Progress Notes (Signed)
Clarified Cardizem drip orders with Dr. Katheren ShamsSalary, pt. HR fluctuation between 80- 100. Cardizem gtt discontinued.

## 2017-04-20 NOTE — Progress Notes (Signed)
Pt. HR running in mid 40's to 60's.

## 2017-04-20 NOTE — H&P (Signed)
[facility] - [place] at [facility]   PATIENT NAME: Lydia Church    MR#:  [id number]  DATE OF BIRTH:  09-Oct-1930  DATE OF ADMISSION:  04/19/2017  PRIMARY CARE PHYSICIAN: System, Pcp Not In   REQUESTING/REFERRING PHYSICIAN:   CHIEF COMPLAINT:   Chief Complaint  Patient presents with  . Dizziness  . Leg Swelling    HISTORY OF PRESENT ILLNESS: Makalya Nave  is a 84 y.o. female with a known history per below presented to the emergency room with 2-week history of dizziness, worsening lower extremity edema, intermittent shortness of breath, in the emergency room patient was found to have acute exacerbation of paroxysmal A. fib with RVR, heart rate fluctuating between 45-134, tachypnea, BNP greater than 200, chest x-ray noted for edema, patient evaluated bedside emergency room, multiple family members at the bedside, patient in no apparent distress, resting comfortably in bed, patient now been admitted for acute on chronic diastolic congestive heart failure exacerbation secondary to A. fib with RVR.  PAST MEDICAL HISTORY:   Past Medical History:  Diagnosis Date  . Anemia   . Arthritis   . Cataract   . Depression    never been treated for depression, has felt that way since Parkinson's   . GERD (gastroesophageal reflux disease)    uses gas ex as needed  . Glaucoma   . Headache(784.0)    because sinus problems  . Hypertension   . Legally blind in right eye, as defined in [place]   . Parkinson's disease (HCC)     PAST SURGICAL HISTORY:  Past Surgical History:  Procedure Laterality Date  . ABDOMINAL HYSTERECTOMY    . CATARACT EXTRACTION W/PHACO Left 09/01/2013   Procedure: CATARACT EXTRACTION PHACO AND INTRAOCULAR LENS PLACEMENT (IOC);  Surgeon: Chalmers Guest, MD;  Location: [facility] OR;  Service: Ophthalmology;  Laterality: Left;  . COLONOSCOPY    . EYE SURGERY     fluid removed from eyes  . MITOMYCIN C APPLICATION Left 09/01/2013   Procedure: MITOMYCIN C APPLICATION;  Surgeon:  Chalmers Guest, MD;  Location: [facility] OR;  Service: Ophthalmology;  Laterality: Left;  . TRABECULECTOMY Left 09/01/2013   Procedure: TRABECULECTOMY LEFT EYE;  Surgeon: Chalmers Guest, MD;  Location: [facility] OR;  Service: Ophthalmology;  Laterality: Left;    SOCIAL HISTORY:  Social History   Tobacco Use  . Smoking status: Never Smoker  . Smokeless tobacco: Never Used  Substance Use Topics  . Alcohol use: No    FAMILY HISTORY:  Family History  Problem Relation Age of Onset  . Hypertension Mother     DRUG ALLERGIES: No Known Allergies  REVIEW OF SYSTEMS:   CONSTITUTIONAL: No fever, +fatigue, weakness.  EYES: No blurred or double vision.  EARS, NOSE, AND THROAT: No tinnitus or ear pain.  RESPIRATORY: + cough, shortness of breath, no wheezing or hemoptysis.  CARDIOVASCULAR: No chest pain, orthopnea, + edema.  GASTROINTESTINAL: No nausea, vomiting, diarrhea or abdominal pain.  GENITOURINARY: No dysuria, hematuria.  ENDOCRINE: No polyuria, nocturia,  HEMATOLOGY: No anemia, easy bruising or bleeding SKIN: No rash or lesion. MUSCULOSKELETAL: No joint pain or arthritis.   NEUROLOGIC: No tingling, numbness, weakness.  PSYCHIATRY: No anxiety or depression.   MEDICATIONS AT HOME:  Prior to Admission medications   Medication Sig Start Date End Date Taking? Authorizing Provider  bisacodyl (DULCOLAX) 5 MG EC tablet Take 1 tablet (5 mg total) by mouth daily as needed for moderate constipation. 12/12/16 12/12/17 Yes Merrily Brittle, MD  Brinzolamide-Brimonidine [facility]) 1-0.2 %  SUSP Place 1 drop into the left eye 3 (three) times daily.   Yes [provider]  cetirizine (ZYRTEC) 10 MG tablet Take 10 mg by mouth daily as needed (congestion).    Yes [provider]  fluticasone (FLONASE) 50 MCG/ACT nasal spray Place 2 sprays into both nostrils daily.   Yes [provider]  furosemide (LASIX) 20 MG tablet Take 20 mg by mouth daily.    Yes [provider]  LUMIGAN  0.01 % SOLN Place 1 drop into both eyes every evening. 04/17/17  Yes [provider]  ondansetron (ZOFRAN ODT) 4 MG disintegrating tablet Take 1 tablet (4 mg total) by mouth every 8 (eight) hours as needed for nausea or vomiting. 12/12/16  Yes Merrily Brittleifenbark, Neil, MD  rOPINIRole (REQUIP) 1 MG tablet Take 1 mg by mouth 3 (three) times daily.   Yes [provider]  VENTOLIN HFA 108 (90 Base) MCG/ACT inhaler Inhale 2 puffs into the lungs every 4 (four) hours as needed. 03/10/17  Yes [provider]  vitamin B-12 (CYANOCOBALAMIN) 1000 MCG tablet Take 1,000 mcg by mouth daily.   Yes [provider]  carbidopa-levodopa (SINEMET IR) 25-100 MG per tablet Take 2 tablets by mouth 3 (three) times daily with meals.     [provider]  Cholecalciferol (VITAMIN D-1000 MAX ST) 1000 units tablet Take 1 tablet by mouth daily.    [provider]  losartan (COZAAR) 50 MG tablet Take 50 mg by mouth daily.    [provider]      PHYSICAL EXAMINATION:   VITAL SIGNS: Blood pressure (!) 128/96, pulse 60, temperature 97.7 F (36.5 C), temperature source Oral, resp. rate (!) 26, height 5\' 4"  (1.626 m), weight 86.2 kg (190 lb), SpO2 93 %.  GENERAL:  [age] y.o.-year-old patient lying in the bed with no acute distress.  Frail appearance EYES: Pupils equal, round, reactive to light and accommodation. No scleral icterus. Extraocular muscles intact.  HEENT: Head atraumatic, normocephalic. Oropharynx and nasopharynx clear.  NECK:  Supple, no jugular venous distention. No thyroid enlargement, no tenderness.  LUNGS: Diminished breath sounds bilaterally. No use of accessory muscles of respiration.  CARDIOVASCULAR: Irregular rate and rhythm, S1, S2 normal. No murmurs, rubs, or gallops.  ABDOMEN: Soft, nontender, nondistended. Bowel sounds present. No organomegaly or mass.  EXTREMITIES: No pedal edema, cyanosis, or clubbing.  NEUROLOGIC: Cranial nerves II through XII are  intact. MAES. Gait not checked.  PSYCHIATRIC: The patient is alert and oriented x 3.  SKIN: No obvious rash, lesion, or ulcer.   LABORATORY PANEL:   CBC Recent Labs  Lab 04/19/17 1849  WBC 4.6  HGB 15.1  HCT 46.5  PLT 176  MCV 96.9  MCH 31.6  MCHC 32.6  RDW 15.4*   ------------------------------------------------------------------------------------------------------------------  Chemistries  Recent Labs  Lab 04/19/17 1849  NA 143  K 3.7  CL 105  CO2 28  GLUCOSE 116*  BUN 15  CREATININE 0.95  CALCIUM 9.9  AST 24  ALT 19  ALKPHOS 61  BILITOT 0.9   ------------------------------------------------------------------------------------------------------------------ estimated creatinine clearance is 46.8 mL/min (by C-G formula based on SCr of 0.95 mg/dL). ------------------------------------------------------------------------------------------------------------------ No results for input(s): TSH, T4TOTAL, T3FREE, THYROIDAB in the last 72 hours.  Invalid input(s): FREET3   Coagulation profile No results for input(s): INR, PROTIME in the last 168 hours. ------------------------------------------------------------------------------------------------------------------- No results for input(s): DDIMER in the last 72 hours. -------------------------------------------------------------------------------------------------------------------  Cardiac Enzymes Recent Labs  Lab 04/19/17 1849  TROPONINI <0.03   ------------------------------------------------------------------------------------------------------------------  Invalid input(s): POCBNP  ---------------------------------------------------------------------------------------------------------------  Urinalysis    Component Value Date/Time   COLORURINE YELLOW (A) 01/31/2015 1927   APPEARANCEUR CLEAR (A) 01/31/2015 1927   APPEARANCEUR Clear 05/29/2012 1736   LABSPEC 1.020 01/31/2015 1927   LABSPEC 1.023  05/29/2012 1736   PHURINE 5.0 01/31/2015 1927   GLUCOSEU NEGATIVE 01/31/2015 1927   GLUCOSEU Negative 05/29/2012 1736   HGBUR 1+ (A) 01/31/2015 1927   BILIRUBINUR NEGATIVE 01/31/2015 1927   BILIRUBINUR Negative 05/29/2012 1736   KETONESUR 1+ (A) 01/31/2015 1927   PROTEINUR NEGATIVE 01/31/2015 1927   NITRITE NEGATIVE 01/31/2015 1927   LEUKOCYTESUR TRACE (A) 01/31/2015 1927   LEUKOCYTESUR 1+ 05/29/2012 1736     RADIOLOGY: Dg Chest 1 View  Result Date: 04/19/2017 CLINICAL DATA:  Shortness of breath and lower extremity edema EXAM: CHEST  1 VIEW COMPARISON:  None. FINDINGS: Unchanged cardiomegaly with mild interstitial pulmonary edema. No pleural effusion or pneumothorax. No focal consolidation. IMPRESSION: Cardiomegaly and mild interstitial pulmonary edema. Electronically Signed   By: Deatra Robinson M.D.   On: 04/19/2017 19:59    EKG: Orders placed or performed during the hospital encounter of 04/19/17  . EKG 12-Lead  . EKG 12-Lead  . ED EKG within 10 minutes  . ED EKG within 10 minutes  . Repeat EKG  . Repeat EKG    IMPRESSION AND PLAN: 1 acute on chronic diastolic congestive heart failure exacerbation secondary to A. fib with RVR Admit to regular nursing floor bed with telemetry, rule out acute coronary syndrome with cardiac enzymes x3 sets, IV Lasix twice daily, strict I&O monitoring, daily weights, losartan, Coreg, aspirin, check echocardiogram, cardiology to see, continue close medical monitoring  2 acute on chronic paroxysmal A. fib with RVR  Cardizem drip with weaning as tolerated, Coreg, not on oral anticoagulation, and all other plans as stated above  3 chronic Parkinson's disease  stable Continue Sinemet  4 chronic GERD without esophagitis Stable PPI daily   All the records are reviewed and case discussed with ED provider. Management plans discussed with the patient, family and they are in agreement.  CODE STATUS:DNR Code Status History    Date Active Date  Inactive Code Status Order ID Comments User Context   01/31/2015 2130 02/01/2015 2005 DNR [id number]  Oralia Manis, MD ED    Questions for Most Recent Historical Code Status (Order [id number])    Question Answer Comment   In the event of cardiac or respiratory ARREST Do not call a "code blue"    In the event of cardiac or respiratory ARREST Do not perform Intubation, CPR, defibrillation or ACLS    In the event of cardiac or respiratory ARREST Use medication by any route, position, wound care, and other measures to relive pain and suffering. May use oxygen, suction and manual treatment of airway obstruction as needed for comfort.        TOTAL TIME TAKING CARE OF THIS PATIENT: 45 minutes.    Evelena Asa Suriah Peragine M.D on 04/20/2017   Between 7am to 6pm - Pager - [phone]  After 6pm go to [email or web address] - password [email or web address]  [facility]  Office  [phone]  CC: Primary care physician; System, Pcp Not In   Note: This dictation was prepared with Dragon dictation along with smaller phrase technology. Any transcriptional errors that result from this process are unintentional.

## 2017-04-20 NOTE — Progress Notes (Signed)
[employer] [facility] - [place] at [facility]                                                                                                                                                                                  Patient Demographics   Lydia Church, is a 84 y.o. female, DOB - [birth year], VWU:[id number]  Admit date - 04/19/2017   Admitting Physician Bertrum Sol, MD  Outpatient Primary MD for the patient is System, Pcp Not In   LOS - 0  Subjective: Patient with Parkinson's disease doing better heart rate improved   Review of Systems:   CONSTITUTIONAL: No documented fever. No fatigue, weakness. No weight gain, no weight loss.  EYES: No blurry or double vision.  ENT: No tinnitus. No postnasal drip. No redness of the oropharynx.  RESPIRATORY: No cough, no wheeze, no hemoptysis. No dyspnea.  CARDIOVASCULAR: No chest pain. No orthopnea. No palpitations. No syncope.  GASTROINTESTINAL: No nausea, no vomiting or diarrhea. No abdominal pain. No melena or hematochezia.  GENITOURINARY: No dysuria or hematuria.  ENDOCRINE: No polyuria or nocturia. No heat or cold intolerance.  HEMATOLOGY: No anemia. No bruising. No bleeding.  INTEGUMENTARY: No rashes. No lesions.  MUSCULOSKELETAL: No arthritis. No swelling. No gout.  NEUROLOGIC: No numbness, tingling, or ataxia. No seizure-type activity.  PSYCHIATRIC: No anxiety. No insomnia. No ADD.    Vitals:   Vitals:   04/20/17 0130 04/20/17 0408 04/20/17 0428 04/20/17 0738  BP: (!) 128/96 102/61  100/63  Pulse: 60 (!) 136  76  Resp: (!) 26 18  15   Temp:  97.6 F (36.4 C)  97.8 F (36.6 C)  TempSrc:  Oral  Oral  SpO2: 93% 96%  91%  Weight:   217 lb 8 oz (98.7 kg)   Height:        Wt Readings from Last 3 Encounters:  04/20/17 217 lb 8 oz (98.7 kg)  12/12/16 205 lb (93 kg)  01/31/15 198 lb 14.4 oz (90.2 kg)     Intake/Output Summary (Last 24 hours) at 04/20/2017 1503 Last data filed at 04/20/2017 1349 Gross per 24 hour   Intake 603 ml  Output 75 ml  Net 528 ml    Physical Exam:   GENERAL: Pleasant-appearing in no apparent distress.  HEAD, EYES, EARS, NOSE AND THROAT: Atraumatic, normocephalic. Extraocular muscles are intact. Pupils equal and reactive to light. Sclerae anicteric. No conjunctival injection. No oro-pharyngeal erythema.  NECK: Supple. There is no jugular venous distention. No bruits, no lymphadenopathy, no thyromegaly.  HEART: Irregularly irregular. No murmurs, no rubs, no clicks.  LUNGS: Clear to auscultation bilaterally. No rales or rhonchi. No wheezes.  ABDOMEN: Soft, flat, nontender,  nondistended. Has good bowel sounds. No hepatosplenomegaly appreciated.  EXTREMITIES: No evidence of any cyanosis, clubbing, or peripheral edema.  +2 pedal and radial pulses bilaterally.  NEUROLOGIC: The patient is alert, awake, and oriented x3 with no focal motor or sensory deficits appreciated bilaterally.  SKIN: Moist and warm with no rashes appreciated.  Psych: Not anxious, depressed LN: No inguinal LN enlargement    Antibiotics   Anti-infectives (From admission, onward)   None      Medications   Scheduled Meds: . carbidopa-levodopa  2 tablet Oral TID WC  . carvedilol  3.125 mg Oral BID WC  . cholecalciferol  1,000 Units Oral Daily  . docusate sodium  100 mg Oral BID  . fluticasone  2 spray Each Nare Daily  . furosemide  20 mg Intravenous BID  . heparin  5,000 Units Subcutaneous Q8H  . latanoprost  1 drop Both Eyes QHS  . loratadine  10 mg Oral Daily  . losartan  50 mg Oral Daily  . midodrine  5 mg Oral TID WC  . pantoprazole (PROTONIX) IV  40 mg Intravenous Q24H  . [START ON 04/21/2017] pneumococcal 23 valent vaccine  0.5 mL Intramuscular Tomorrow-1000  . rOPINIRole  1 mg Oral TID  . sodium chloride flush  3 mL Intravenous Q12H  . sodium chloride flush  3 mL Intravenous Q12H  . vitamin B-12  1,000 mcg Oral Daily   Continuous Infusions: . sodium chloride     PRN Meds:.sodium  chloride, acetaminophen **OR** acetaminophen, albuterol, bisacodyl, bisacodyl, HYDROcodone-acetaminophen, ondansetron **OR** ondansetron (ZOFRAN) IV, polyethylene glycol, sodium chloride flush   Data Review:   Micro Results No results found for this or any previous visit (from the past 240 hour(s)).  Radiology Reports Dg Chest 1 View  Result Date: 04/19/2017 CLINICAL DATA:  Shortness of breath and lower extremity edema EXAM: CHEST  1 VIEW COMPARISON:  None. FINDINGS: Unchanged cardiomegaly with mild interstitial pulmonary edema. No pleural effusion or pneumothorax. No focal consolidation. IMPRESSION: Cardiomegaly and mild interstitial pulmonary edema. Electronically Signed   By: Deatra RobinsonKevin  Herman M.D.   On: 04/19/2017 19:59     CBC Recent Labs  Lab 04/19/17 1849 04/20/17 0454  WBC 4.6 4.4  HGB 15.1 16.3*  HCT 46.5 49.5*  PLT 176 178  MCV 96.9 96.2  MCH 31.6 31.6  MCHC 32.6 32.9  RDW 15.4* 15.3*    Chemistries  Recent Labs  Lab 04/19/17 1849 04/20/17 0454  NA 143 139  K 3.7 3.6  CL 105 102  CO2 28 26  GLUCOSE 116* 161*  BUN 15 12  CREATININE 0.95 0.80  CALCIUM 9.9 9.5  AST 24  --   ALT 19  --   ALKPHOS 61  --   BILITOT 0.9  --    ------------------------------------------------------------------------------------------------------------------ estimated creatinine clearance is 59.7 mL/min (by C-G formula based on SCr of 0.8 mg/dL). ------------------------------------------------------------------------------------------------------------------ No results for input(s): HGBA1C in the last 72 hours. ------------------------------------------------------------------------------------------------------------------ No results for input(s): CHOL, HDL, LDLCALC, TRIG, CHOLHDL, LDLDIRECT in the last 72 hours. ------------------------------------------------------------------------------------------------------------------ Recent Labs    04/20/17 0454  TSH 3.408    ------------------------------------------------------------------------------------------------------------------ No results for input(s): VITAMINB12, FOLATE, FERRITIN, TIBC, IRON, RETICCTPCT in the last 72 hours.  Coagulation profile No results for input(s): INR, PROTIME in the last 168 hours.  No results for input(s): DDIMER in the last 72 hours.  Cardiac Enzymes Recent Labs  Lab 04/19/17 1849 04/20/17 0454 04/20/17 1034  TROPONINI <0.03 0.03* <0.03   ------------------------------------------------------------------------------------------------------------------ Invalid input(s): POCBNP  Assessment & Plan   1 acute on chronic diastolic congestive heart failure exacerbation secondary to A. fib with RVR Continue IV Lasix due to low blood pressure I will add midodrine to current regimen,  Continue Coreg  2 acute on chronic paroxysmal A. fib with RVR  Heart rate much improved, continue Coreg  3 chronic Parkinson's disease  stable Continue Sinemet  4 chronic GERD without esophagitis Stable PPI daily        Code Status Orders  (From admission, onward)        Start     Ordered   04/20/17 0428  Do not attempt resuscitation (DNR)  Continuous    Question Answer Comment  In the event of cardiac or respiratory ARREST Do not call a "code blue"   In the event of cardiac or respiratory ARREST Do not perform Intubation, CPR, defibrillation or ACLS   In the event of cardiac or respiratory ARREST Use medication by any route, position, wound care, and other measures to relive pain and suffering. May use oxygen, suction and manual treatment of airway obstruction as needed for comfort.      04/20/17 0427    Code Status History    Date Active Date Inactive Code Status Order ID Comments User Context   01/31/2015 2130 02/01/2015 2005 DNR [id number]  Oralia Manis, MD ED    Advance Directive Documentation     Most Recent Value  Type of Advance Directive  Healthcare  Power of Attorney, Living will  Pre-existing out of facility DNR order (yellow form or pink MOST form)  -  "MOST" Form in Place?  -           Consults cardiology  DVT Prophylaxis continue heparin Lab Results  Component Value Date   PLT 178 04/20/2017     Time Spent in minutes   Greater than 50% of time spent in care coordination and counseling patient regarding the condition and plan of care.   Auburn Bilberry M.D on 04/20/2017 at 3:03 PM  Between 7am to 6pm - Pager - [phone]  After 6pm go to [email or web address] - Social research officer, government  [employer]   Office  [phone]

## 2017-04-20 NOTE — Progress Notes (Signed)
No further nausea or vomiting, will continue to monitor

## 2017-04-20 NOTE — Progress Notes (Signed)
Back from cardiovascular testing 

## 2017-04-20 NOTE — Progress Notes (Signed)
Pt.'s son stated she has been having some trouble with the teeth on her lower right side of mouth. He would like her to have them assessed while she is here or a referral for out patient.

## 2017-04-20 NOTE — Clinical Social Work Note (Signed)
The CSW received a consult that the patient uses [facility] for Home Health. Home Health is the role of care management. Please consult care management. CSW is signing off. Please consult should needs arise.  Argentina PonderKaren Martha Golda Zavalza, MSW, Theresia MajorsLCSWA [phone]

## 2017-04-20 NOTE — Consult Note (Signed)
[facility]  CARDIOLOGY CONSULT NOTE  Patient ID: Lydia Church MRN: [id number] DOB/AGE: Mar 17, 1930 84 y.o.  Admit date: 04/19/2017 Referring Physician Salary Primary Physician  Primary Cardiologist [facility] Reason for Consultation atrial fibrillation  HPI: 84 year old female referred for evaluation of atrial fibrillation and congestive heart failure.  The patient has a history of paroxysmal atrial fibrillation, essential hypertension, and Parkinson's disease.  She presents with 2-week history of worsening peripheral edema, shortness of breath, and dizziness.  In the emergency room patient was noted to be in atrial fibrillation with variable rate.  ECG revealed atrial fibrillation rate of 97 bpm.  Telemetry reveals atrial fibrillation with a heart rate of 74 bpm.  The patient was treated with intravenous furosemide with prompt diuresis and overall clinical improvement.  Admission labs notable for negative troponin of 0.03 x 2.  Review of systems complete and found to be negative unless listed above     Past Medical History:  Diagnosis Date  . Anemia   . Arthritis   . Cataract   . Depression    never been treated for depression, has felt that way since Parkinson's   . GERD (gastroesophageal reflux disease)    uses gas ex as needed  . Glaucoma   . Headache(784.0)    because sinus problems  . Hypertension   . Legally blind in right eye, as defined in [place]   . Parkinson's disease [facility])     Past Surgical History:  Procedure Laterality Date  . ABDOMINAL HYSTERECTOMY    . CATARACT EXTRACTION W/PHACO Left 09/01/2013   Procedure: CATARACT EXTRACTION PHACO AND INTRAOCULAR LENS PLACEMENT (IOC);  Surgeon: Chalmers Guest, MD;  Location: [facility] OR;  Service: Ophthalmology;  Laterality: Left;  . COLONOSCOPY    . EYE SURGERY     fluid removed from eyes  . MITOMYCIN C APPLICATION Left 09/01/2013   Procedure: MITOMYCIN C APPLICATION;  Surgeon: Chalmers Guest, MD;  Location: [facility] OR;  Service:  Ophthalmology;  Laterality: Left;  . TRABECULECTOMY Left 09/01/2013   Procedure: TRABECULECTOMY LEFT EYE;  Surgeon: Chalmers Guest, MD;  Location: [facility] OR;  Service: Ophthalmology;  Laterality: Left;    Medications Prior to Admission  Medication Sig Dispense Refill Last Dose  . bisacodyl (DULCOLAX) 5 MG EC tablet Take 1 tablet (5 mg total) by mouth daily as needed for moderate constipation. 30 tablet 0 prn at prn  . Brinzolamide-Brimonidine (SIMBRINZA) 1-0.2 % SUSP Place 1 drop into the left eye 3 (three) times daily.   Unknown at Unknown  . cetirizine (ZYRTEC) 10 MG tablet Take 10 mg by mouth daily as needed (congestion).    prn at prn  . fluticasone (FLONASE) 50 MCG/ACT nasal spray Place 2 sprays into both nostrils daily.   04/19/2017 at Unknown time  . furosemide (LASIX) 20 MG tablet Take 20 mg by mouth daily.    04/19/2017 at Unknown time  . LUMIGAN 0.01 % SOLN Place 1 drop into both eyes every evening.  6 04/19/2017 at Unknown time  . ondansetron (ZOFRAN ODT) 4 MG disintegrating tablet Take 1 tablet (4 mg total) by mouth every 8 (eight) hours as needed for nausea or vomiting. 20 tablet 0 prn at prn  . rOPINIRole (REQUIP) 1 MG tablet Take 1 mg by mouth 3 (three) times daily.   04/19/2017 at Unknown time  . VENTOLIN HFA 108 (90 Base) MCG/ACT inhaler Inhale 2 puffs into the lungs every 4 (four) hours as needed.  6 prn at prn  . vitamin B-12 (CYANOCOBALAMIN)  1000 MCG tablet Take 1,000 mcg by mouth daily.   04/19/2017 at Unknown time  . carbidopa-levodopa (SINEMET IR) 25-100 MG per tablet Take 2 tablets by mouth 3 (three) times daily with meals.    Not Taking at Unknown time  . Cholecalciferol (VITAMIN D-1000 MAX ST) 1000 units tablet Take 1 tablet by mouth daily.   Not Taking at Unknown time  . losartan (COZAAR) 50 MG tablet Take 50 mg by mouth daily.   Not Taking at Unknown time   Social History   Socioeconomic History  . Marital status: Widowed    Spouse name: Not on file  . Number of children: Not  on file  . Years of education: Not on file  . Highest education level: Not on file  Occupational History  . Not on file  Social Needs  . Financial resource strain: Not on file  . Food insecurity:    Worry: Not on file    Inability: Not on file  . Transportation needs:    Medical: Not on file    Non-medical: Not on file  Tobacco Use  . Smoking status: Never Smoker  . Smokeless tobacco: Never Used  Substance and Sexual Activity  . Alcohol use: No  . Drug use: No  . Sexual activity: Not on file  Lifestyle  . Physical activity:    Days per week: Not on file    Minutes per session: Not on file  . Stress: Not on file  Relationships  . Social connections:    Talks on phone: Not on file    Gets together: Not on file    Attends religious service: Not on file    Active member of club or organization: Not on file    Attends meetings of clubs or organizations: Not on file    Relationship status: Not on file  . Intimate partner violence:    Fear of current or ex partner: Not on file    Emotionally abused: Not on file    Physically abused: Not on file    Forced sexual activity: Not on file  Other Topics Concern  . Not on file  Social History Narrative  . Not on file    Family History  Problem Relation Age of Onset  . Hypertension Mother       Review of systems complete and found to be negative unless listed above      PHYSICAL EXAM  General: Well developed, well nourished, in no acute distress HEENT:  Normocephalic and atramatic Neck:  No JVD.  Lungs: Clear bilaterally to auscultation and percussion. Heart: HRRR . Normal S1 and S2 without gallops or murmurs.  Abdomen: Bowel sounds are positive, abdomen soft and non-tender  Msk:  Back normal, normal gait. Normal strength and tone for age. Extremities: No clubbing, cyanosis or edema.   Neuro: Alert and oriented X 3. Psych:  Good affect, responds appropriately  Labs:   Lab Results  Component Value Date   WBC 4.4  04/20/2017   HGB 16.3 (H) 04/20/2017   HCT 49.5 (H) 04/20/2017   MCV 96.2 04/20/2017   PLT 178 04/20/2017    Recent Labs  Lab 04/19/17 1849 04/20/17 0454  NA 143 139  K 3.7 3.6  CL 105 102  CO2 28 26  BUN 15 12  CREATININE 0.95 0.80  CALCIUM 9.9 9.5  PROT 7.5  --   BILITOT 0.9  --   ALKPHOS 61  --   ALT 19  --  AST 24  --   GLUCOSE 116* 161*   Lab Results  Component Value Date   TROPONINI 0.03 (HH) 04/20/2017   No results found for: CHOL No results found for: HDL No results found for: LDLCALC No results found for: TRIG No results found for: CHOLHDL No results found for: LDLDIRECT    Radiology: Dg Chest 1 View  Result Date: 04/19/2017 CLINICAL DATA:  Shortness of breath and lower extremity edema EXAM: CHEST  1 VIEW COMPARISON:  None. FINDINGS: Unchanged cardiomegaly with mild interstitial pulmonary edema. No pleural effusion or pneumothorax. No focal consolidation. IMPRESSION: Cardiomegaly and mild interstitial pulmonary edema. Electronically Signed   By: Deatra Robinson M.D.   On: 04/19/2017 19:59    EKG: Atrial fibrillation at a rate of 74 bpm  ASSESSMENT AND PLAN:   1.  Agree with overall current therapy 2.  Continue diuresis 3.  Carefully monitor renal status 4.  Defer chronic anticoagulation at this time.  This can be further addressed as outpatient with Dr. Juliann Pares. 5.  Review 2D echocardiogram 6.  Further recommendations pending echocardiogram results  Signed: Marcina Millard MD,PhD, [facility] 04/20/2017, 9:37 AM

## 2017-04-20 NOTE — Progress Notes (Addendum)
Pt. Arrived via stretcher staff transferred to bed. Family at bedside. Pt alert and oriented with no c/o pain. Pt. blind in right eye. Skin assessed with Hansel StarlingAdrienne, RN, no skin issues noted. Tele called to CCMD and verified with Trey PaulaJeff, CNA. PT given a sandwich tray which family fed to pt. Pt. Dose has visible tremors to bilateral hands.  General room orientation given. Instruction on how to use ascom and call bell system given. External catheter in place. Will continue to monitor pt. Pt is a total assist at this time.

## 2017-04-20 NOTE — Evaluation (Signed)
Physical Therapy Evaluation Patient Details Name: Lydia Church MRN: [id number] DOB: 10-27-1930 Today's Date: 04/20/2017   History of Present Illness  Patientis a86 y.o.femalewith a known historyper below presented to the emergency room with 2-week history of dizziness, worsening lower extremity edema, intermittent shortness of breath, in the emergency room patient was found to have acute exacerbation of paroxysmal A. fib with RVR, heart rate fluctuating between 45-134, tachypnea, BNP greater than 200, chest x-ray noted for edema.  Pt. admitted for acute on chronic diastolic congestive heart failure exacerbation secondary to A. fib with RVR.  Clinical Impression  Patient is a pleasant 84 year old female who was admitted to physical therapy for chronic diastolic CHF secondary to A fib with RVR. Patient presents with weakness of BLE and limited ability to sit EOB due to dizziness and fatigue. Patient deferred standing due to dizziness and fatigue. Seated and supine interventions implemented. Patient has history of diagnosis of Parkinson's evident upon initiation of movement. Patient would benefit from skilled physical therapy to increase LE strength, seated/standing balance, and improve mobility for improved quality of life.      Follow Up Recommendations Home health PT    Equipment Recommendations       Recommendations for Other Services       Precautions / Restrictions Precautions Precautions: Fall Precaution Comments: dizziness upon seated.  Restrictions Weight Bearing Restrictions: No      Mobility  Bed Mobility Overal bed mobility: Needs Assistance Bed Mobility: Rolling;Supine to Sit;Sit to Supine Rolling: Modified independent (Device/Increase time)(require handrail)   Supine to sit: Mod assist Sit to supine: Mod assist      Transfers                 General transfer comment: unable to perform due to fatigue and dizziness  Ambulation/Gait              General Gait Details: did not perform due to dizziness,, fatigue, and patient decline.   Stairs            Wheelchair Mobility    Modified Rankin (Stroke Patients Only)       Balance Overall balance assessment: Needs assistance Sitting-balance support: Single extremity supported;Feet supported Sitting balance-Leahy Scale: Poor Sitting balance - Comments: excessive posterior trunk lean with LE movement Postural control: Posterior lean                                   Pertinent Vitals/Pain Pain Assessment: No/denies pain    Home Living Family/patient expects to be discharged to:: Private residence Living Arrangements: Children Available Help at Discharge: Family Type of Home: House Home Access: Ramped entrance     Home Layout: One level Home Equipment: Walker - standard;Shower seat;Grab bars - toilet;Grab bars - tub/shower Additional Comments: Patient lives at home. Her children are with her splitting the duty between the two sons, with one son living with her.     Prior Function Level of Independence: Needs assistance   Gait / Transfers Assistance Needed: utilizes walker at home, only walks to bathroom and back  ADL's / Homemaking Assistance Needed: needs assistance with dressing and cooking        Hand Dominance   Dominant Hand: Right    Extremity/Trunk Assessment   Upper Extremity Assessment Upper Extremity Assessment: Generalized weakness;Defer to OT evaluation    Lower Extremity Assessment Lower Extremity Assessment: Generalized weakness;RLE deficits/detail;LLE deficits/detail RLE Deficits /  Details: 3/5 strength with pain in R quadricep upon SLR RLE Coordination: decreased gross motor LLE Deficits / Details: 3+/5 strength  LLE Coordination: decreased gross motor       Communication   Communication: No difficulties  Cognition Arousal/Alertness: Awake/alert Behavior During Therapy: WFL for tasks assessed/performed Overall  Cognitive Status: Within Functional Limits for tasks assessed                                 General Comments: Patient has limited vision and requires mod cueing for positioning and steps for movements.       General Comments      Exercises General Exercises - Lower Extremity Ankle Circles/Pumps: AROM;Both;10 reps;Supine Long Arc Quad: AROM;Both;5 reps;Seated(require Mod A to remain upright) Heel Slides: Both;Strengthening;10 reps;Supine Hip ABduction/ADduction: Strengthening;Both;5 reps Straight Leg Raises: Strengthening;Both;5 reps;Supine Other Exercises Other Exercises: seated balance x 5 minutes, patient fatigued and reported dizziness.    Assessment/Plan    PT Assessment Patient needs continued PT services  PT Problem List Decreased strength;Decreased activity tolerance;Decreased balance;Decreased mobility       PT Treatment Interventions Gait training;Therapeutic exercise;Therapeutic activities;Functional mobility training;Balance training;Neuromuscular re-education;Patient/family education    PT Goals (Current goals can be found in the Care Plan section)  Acute Rehab PT Goals Patient Stated Goal: Patient wants to return home  PT Goal Formulation: With patient/family Time For Goal Achievement: 05/04/17 Potential to Achieve Goals: Fair    Frequency Min 2X/week   Barriers to discharge        Co-evaluation               AM-PAC PT "6 Clicks" Daily Activity  Outcome Measure Difficulty turning over in bed (including adjusting bedclothes, sheets and blankets)?: A Lot Difficulty moving from lying on back to sitting on the side of the bed? : Unable Difficulty sitting down on and standing up from a chair with arms (e.g., wheelchair, bedside commode, etc,.)?: Unable Help needed moving to and from a bed to chair (including a wheelchair)?: A Little Help needed walking in hospital room?: A Lot Help needed climbing 3-5 steps with a railing? : A Lot 6  Click Score: 11    End of Session Equipment Utilized During Treatment: Gait belt Activity Tolerance: Patient limited by fatigue Patient left: in bed;with family/visitor present;with bed alarm set Nurse Communication: Mobility status PT Visit Diagnosis: Unsteadiness on feet (R26.81);Muscle weakness (generalized) (M62.81);Other abnormalities of gait and mobility (R26.89)    Time: 5366-4403 PT Time Calculation (min) (ACUTE ONLY): 33 min   Charges:   PT Evaluation $PT Eval Low Complexity: 1 Low PT Treatments $Therapeutic Exercise: 8-22 mins   PT G Codes:        Precious Bard, PT, DPT    Precious Bard 04/20/2017, 2:22 PM

## 2017-04-20 NOTE — Progress Notes (Signed)
Per pt son pt. Had not been taking parkinson medication as prescribed because it was making her feel sick. He would like this to be investigated, to se if she needs her medication changed.

## 2017-04-20 NOTE — Progress Notes (Signed)
Family Meeting Note  Advance Directive:yes  Today a meeting took place with the Patient, patient's 2 sons, daughter-in-law.  Patient is able to participate  The following clinical team members were present during this meeting:MD  The following were discussed:Patient's diagnosis: , Patient's progosis: > 12 months and Goals for treatment: DNR  Additional follow-up to be provided: prn  Time spent during discussion:20 minutes  Bertrum SolMontell D Maddalynn Barnard, MD

## 2017-04-20 NOTE — Progress Notes (Signed)
Pt vomited approx 75ml yellowish vomit with egg noted.  Zofran 4mg  iv given, will monitor.

## 2017-04-20 NOTE — Progress Notes (Signed)
Complaining of nausea, Zofran 4mg  IV, will monitor.

## 2017-04-20 NOTE — Progress Notes (Signed)
Lasix and Coreg held this morning due to BP 100/63.  Dr. Eliane DecreeS Patel and Dr. Darrold JunkerParaschos aware.

## 2017-04-20 NOTE — Plan of Care (Signed)
  Problem: Clinical Measurements: Goal: Will remain free from infection Outcome: Progressing Note:  Remains afebrile   Problem: Safety: Goal: Ability to remain free from injury will improve Outcome: Progressing Note:  Fall precautions in place, non skid socks when oob   Problem: Clinical Measurements: Goal: Diagnostic test results will improve Outcome: Not Progressing Note:  BNP 251 K 3.0   Problem: Cardiac: Goal: Ability to achieve and maintain adequate cardiopulmonary perfusion will improve Outcome: Not Progressing Note:  Remains in Afib, cardiology consult today along with 2Decho

## 2017-04-20 NOTE — Progress Notes (Signed)
To cardiovascular testing via bed 

## 2017-04-21 MED ORDER — LISINOPRIL 5 MG PO TABS
2.5000 mg | ORAL_TABLET | Freq: Every day | ORAL | Status: DC
  Administered 2017-04-21: 2.5 mg via ORAL
  Filled 2017-04-21: qty 1

## 2017-04-21 MED ORDER — FUROSEMIDE 20 MG PO TABS
20.0000 mg | ORAL_TABLET | Freq: Two times a day (BID) | ORAL | Status: DC
  Administered 2017-04-21 – 2017-04-22 (×2): 20 mg via ORAL
  Filled 2017-04-21 (×2): qty 1

## 2017-04-21 NOTE — Evaluation (Signed)
Occupational Therapy Evaluation Patient Details Name: Lydia Church MRN: [id number] DOB: 07-30-1930 Today's Date: 04/21/2017    History of Present Illness Patientis a84 y.o.femalеwith a known historyper below presented to the emergency room with 2-week history of dizziness, worsening lower extremity edema, intermittent shortness of breath, in the emergency room patient was found to have acute exacerbation of paroxysmal A. fib with RVR, heart rate fluctuating between 45-134, tachypnea, BNP greater than 200, chest x-ray noted for edema.  Pt. admitted for acute on chronic diastolic congestive heart failure exacerbation secondary to A. fib with RVR.   Clinical Impression   Pt is 84 year old female who presents with 2 week onset of dizziness, intermittent SOB and worsening LE edema and found to have acute exacerbation of paroxysmal A fib with RVR, HR from 45-134, tachypnea, BNP greater than 200 and chest x-ray noted for edema.  Pt admitted with acute on chronic diastolic CHF secondary to A fib with RVR. She also has Parkinsons with bilateral tremors. Pt has been at home with her 2 sons and other family rotating care for her. She reports difficluty with feeding herself due to tremors and reviewed handout about AD and given a catalog as a reference.  Rec built up weighted utensils, 2 handed cup, dycem or adhesive shelving paper to stabilize plate and bowl and extra long straws since that is how she has been drinking at home. Rec continued OT while in hospital to continue to work on increasing independence in ADLs with education in available AD, balance and functional mobility training, coordination exercises and family ed and training and OT [facility] after discharge.    Follow Up Recommendations  Home health OT    Equipment Recommendations       Recommendations for Other Services       Precautions / Restrictions Precautions Precautions: Fall Restrictions Weight Bearing Restrictions: No       Mobility Bed Mobility                  Transfers                      Balance                                           ADL either performed or assessed with clinical judgement   ADL Overall ADL's : Needs assistance/impaired Eating/Feeding: Set up;Minimal assistance;Sitting Eating/Feeding Details (indicate cue type and reason): mainly due to B tremors; rec weighted utensils with built up handles Grooming: Wash/dry hands;Wash/dry face;Oral care;Set up;Minimal assistance           Upper Body Dressing : Set up;Sitting;Minimal assistance   Lower Body Dressing: Moderate assistance;Sit to/from stand;+2 for physical assistance                       Vision Baseline Vision/History: Legally blind Patient Visual Report: No change from baseline       Perception     Praxis      Pertinent Vitals/Pain Pain Assessment: No/denies pain     Hand Dominance Right   Extremity/Trunk Assessment Upper Extremity Assessment Upper Extremity Assessment: Generalized weakness;RUE deficits/detail;LUE deficits/detail RUE Deficits / Details: tremors intermittently which have been present for a while per pt and family; contracture in R elbow with about 20 degrees from full extension. Tenderness in B biceps RUE Coordination: decreased  fine motor LUE Deficits / Details: tremors intermittently which have been present for a while per pt and family LUE Coordination: decreased fine motor           Communication Communication Communication: No difficulties   Cognition Arousal/Alertness: Awake/alert Behavior During Therapy: WFL for tasks assessed/performed Overall Cognitive Status: Within Functional Limits for tasks assessed                                 General Comments: Patient has limited vision and requires mod cueing for positioning to stay in neutral and not hold head to the right.  Tremors intermittent B hands.   General  Comments       Exercises     Shoulder Instructions      Home Living Family/patient expects to be discharged to:: Private residence Living Arrangements: Children Available Help at Discharge: Family Type of Home: House Home Access: Ramped entrance     Home Layout: One level     Bathroom Shower/Tub: Chief Strategy Officer: Standard Bathroom Accessibility: Yes   Home Equipment: Walker - standard;Shower seat;Grab bars - toilet;Grab bars - tub/shower   Additional Comments: Patient lives at home. Her children are with her splitting the duty between the two sons, with one son living with her.       Prior Functioning/Environment Level of Independence: Needs assistance  Gait / Transfers Assistance Needed: utilizes walker at home, only walks to bathroom and back ADL's / Homemaking Assistance Needed: needs assistance with dressing and cooking   Comments: pt holding her head to the right and reports that this started about 2 weeks ago; she is legally blind.        OT Problem List: Decreased range of motion;Decreased activity tolerance;Decreased knowledge of use of DME or AE;Decreased coordination;Decreased strength      OT Treatment/Interventions: Self-care/ADL training;DME and/or AE instruction;Patient/family education;Visual/perceptual remediation/compensation    OT Goals(Current goals can be found in the care plan section) Acute Rehab OT Goals Patient Stated Goal: "I want to get better so I can go home soon" OT Goal Formulation: With patient/family Time For Goal Achievement: 05/05/17 Potential to Achieve Goals: Good ADL Goals Pt Will Perform Eating: with set-up;with min guard assist;with adaptive utensils;sitting Pt Will Perform Grooming: with set-up;with min assist;sitting;with adaptive equipment Pt Will Perform Lower Body Dressing: sit to/from stand;with mod assist;with set-up Pt Will Transfer to Toilet: with set-up;with mod assist;bedside commode;squat pivot  transfer  OT Frequency: Min 1X/week   Barriers to D/C:            Co-evaluation              AM-PAC PT "6 Clicks" Daily Activity     Outcome Measure Help from another person eating meals?: A Little Help from another person taking care of personal grooming?: A Little Help from another person toileting, which includes using toliet, bedpan, or urinal?: A Lot Help from another person bathing (including washing, rinsing, drying)?: A Lot Help from another person to put on and taking off regular upper body clothing?: A Little Help from another person to put on and taking off regular lower body clothing?: A Lot 6 Click Score: 15   End of Session Nurse Communication: Other (comment)(family wanting to talk to Cardiologist--Steven from [facility] aware and to find out who that is)  Activity Tolerance: Patient tolerated treatment well Patient left: in bed;with call bell/phone within reach;with bed alarm set  Time: [id number] OT Time Calculation (min): 32 min Charges:  OT General Charges $OT Visit: 1 Visit OT Evaluation $OT Eval Low Complexity: 1 Low OT Treatments $Self Care/Home Management : 8-22 mins G-Codes:     Susanne BordersSusan Wofford, OTR/L ascom [phone] 04/21/17, 3:41 PM

## 2017-04-21 NOTE — Evaluation (Addendum)
Clinical/Bedside Swallow Evaluation Patient Details  Name: Lydia Church MRN: [id number] Date of Birth: August 24, 1930  Today's Date: 04/21/2017 Time: SLP Start Time (ACUTE ONLY): 1630 SLP Stop Time (ACUTE ONLY): 1730 SLP Time Calculation (min) (ACUTE ONLY): 60 min  Past Medical History:  Past Medical History:  Diagnosis Date  . Anemia   . Arthritis   . Cataract   . Depression    never been treated for depression, has felt that way since Parkinson's   . GERD (gastroesophageal reflux disease)    uses gas ex as needed  . Glaucoma   . Headache(784.0)    because sinus problems  . Hypertension   . Legally blind in right eye, as defined in [place]   . Parkinson's disease [facility](HCC)    Past Surgical History:  Past Surgical History:  Procedure Laterality Date  . ABDOMINAL HYSTERECTOMY    . CATARACT EXTRACTION W/PHACO Left 09/01/2013   Procedure: CATARACT EXTRACTION PHACO AND INTRAOCULAR LENS PLACEMENT (IOC);  Surgeon: Chalmers Guestoy Whitaker, MD;  [facility];  Service: Ophthalmology;  Laterality: Left;  . COLONOSCOPY    . EYE SURGERY     fluid removed from eyes  . MITOMYCIN C APPLICATION Left 09/01/2013   Procedure: MITOMYCIN C APPLICATION;  Surgeon: Chalmers Guestoy Whitaker, MD;  [facility];  Service: Ophthalmology;  Laterality: Left;  . TRABECULECTOMY Left 09/01/2013   Procedure: TRABECULECTOMY LEFT EYE;  Surgeon: Chalmers Guestoy Whitaker, MD;  [facility];  Service: Ophthalmology;  Laterality: Left;   HPI:   Pt is a 84 y.o. female with a known history per below including GERD and Parkinson's Dis. which results in UE and H/N tremors for her who presented to the emergency room with 2-week history of dizziness, worsening lower extremity edema, intermittent shortness of breath, in the emergency room patient was found to have acute exacerbation of paroxysmal A. fib with RVR, heart rate fluctuating between 45-134, tachypnea, BNP greater than 200, chest x-ray noted for edema, patient evaluated bedside emergency room, multiple  family members at the bedside, patient in no apparent distress, resting comfortably in bed, patient now been admitted for acute on chronic diastolic congestive heart failure exacerbation secondary to A. fib with RVR. Sons present in room. Pt reported difficulty swallowing Pills. She uses supplement nutritional drinks(ensure) per her report. She is legally blind in right eye.    Assessment / Plan / Recommendation Clinical Impression  Pt appeared to present w/ adequate oropharyngeal phase swallow functioning w/ reduced risk for aspiration when following general aspiration precautions. Pt consumed po's during her dinner meal to include purees and thin liquids (multiple ozs via Straw) w/ no overt s/s of aspiration noted; no decline in respiratory status or vocal quality post trials. Pt consumed all liquid trials via Straw as at baseline per Sons. Oral phase appeared grossly wfl w/ trials that were in small bolus amounts, moistened. Pt reported min extra time needed for increased textured foods such as meats which "are a little tougher for me" - suggested small, cut pieces moistened well and lessening a specific meat/food if too tough for mastication and effort overall. Pt does require feeding assistance d/t UE tremors, weakness. Due to pt's c/o difficulty swallowing Pills, few Pills left by NSG were given WHOLE in Puree (mashed potatoes) which she swallowed easily w/out difficulty. Recommended all further Pills be swallowed in a Puree for safer swallowing. Sons/pt agreed. No further skilled ST services indicated at this time as pt appears at her baseline re: swallowing and strategy/option was given  for swallowing Pills more comfortably and safely. Pt agreed. Recommend diet as below. NSG updated.   SLP Visit Diagnosis: Dysphagia, unspecified (R13.10)    Aspiration Risk  (reduced following general aspiration precautions)    Diet Recommendation  Mech Soft diet (dys. Level 3) w/ gravy to moisten foods for easier  mastication; Thin liquids via straw - monitoring and assisting. General aspiration precautions.   Medication Administration: Whole meds with puree(for safer, easier swallowing)    Other  Recommendations Recommended Consults: (Dietician f/u) Oral Care Recommendations: Oral care BID;Staff/trained caregiver to provide oral care Other Recommendations: (n/a)   Follow up Recommendations None      Frequency and Duration (n/a)  (n/a)       Prognosis Prognosis for Safe Diet Advancement: Good Barriers to Reach Goals: (Parkinson's Dis.)      Swallow Study   General Date of Onset: 04/19/17 Type of Study: Bedside Swallow Evaluation Previous Swallow Assessment: none reported Diet Prior to this Study: Regular;Thin liquids Temperature Spikes Noted: No(wbc 4.4) Respiratory Status: Room air History of Recent Intubation: No Behavior/Cognition: Alert;Cooperative;Pleasant mood;Requires cueing(eyes closed somewhat; tremors noted all over; oral) Oral Cavity Assessment: Within Functional Limits Oral Care Completed by SLP: Recent completion by staff Oral Cavity - Dentition: Adequate natural dentition(missing few) Vision: (n/a) Self-Feeding Abilities: Total assist(increased Tremors d/t Parkinson's Dis.) Patient Positioning: Upright in bed(head needed supporting) Baseline Vocal Quality: Normal;Low vocal intensity Volitional Cough: Strong Volitional Swallow: Able to elicit(w/ time)    Oral/Motor/Sensory Function Overall Oral Motor/Sensory Function: Mild impairment(oral tremors; lingual tremors) Facial Symmetry: Within Functional Limits Lingual Symmetry: Within Functional Limits   Ice Chips Ice chips: Not tested Other Comments: "too cold"   Thin Liquid Thin Liquid: Within functional limits Presentation: Straw(fed by Sons; ~12+ ozs) Other Comments: noted min oral-lingual tremors but these did not appear to impact bolus management and control of the thin liquids    Nectar Thick Nectar Thick Liquid:  Not tested   Honey Thick Honey Thick Liquid: Not tested Presentation: Self fed   Puree Puree: Within functional limits Presentation: Spoon(fed; ~6ozs total) Other Comments: min increased time for f/u swallow and oral clearing x1-2 but [facility]   Solid   GO   Solid: Impaired Other Comments: pt and Sons reported min increased time for mastication of the increased solids - mostly meats         Jerilynn Som, MS, CCC-SLP [place] 04/21/2017,5:52 PM

## 2017-04-21 NOTE — Progress Notes (Signed)
Patient just had a 10 beat run of V-Tach. Informed Dr. Imogene Burnhen via text page. Awaiting possible new order(s). Lydia Church

## 2017-04-21 NOTE — Plan of Care (Signed)
  Problem: Health Behavior/Discharge Planning: Goal: Ability to manage health-related needs will improve Outcome: Progressing Note:  Has requested to work w/ PT today several times. PT contacted. Therapist working w/ the patient now. Awaiting new recommendations for today. Lydia FavreSteven M [facility]

## 2017-04-21 NOTE — Care Management (Signed)
Patient admitted from home with congestive heart failure due to rapid atrial fib.Patient [place] currently [place].  Patient's so is agreeable to have home health and [facility]  They do not have occupational therapy. CM spoke with OT that performed patient's evaluation and it is felt that agency"sphysical therapist could assist with recommendation of aids that are in the catalog that ws provided.  Informed patient's son of recommendation and agency preference is still [facility]. Family will transport patient home

## 2017-04-21 NOTE — Progress Notes (Signed)
[employer] [facility] [place] [facility]                                                                                                                                                                                  Patient Demographics   Lydia Church, is a 84 y.o. female, DOB - Jan 06, 1931, ZOX:[id number]  Admit date - 04/19/2017   Admitting Physician Bertrum Sol, MD  Outpatient Primary MD for the patient is System, Pcp Not In   LOS - 1  Subjective: Patient has no complaints.  She has chronic tremor due to Parkinson's disease.  Review of Systems:   CONSTITUTIONAL: No documented fever. No fatigue, weakness. No weight gain, no weight loss.  EYES: No blurry or double vision.  ENT: No tinnitus. No postnasal drip. No redness of the oropharynx.  RESPIRATORY: No cough, no wheeze, no hemoptysis. No dyspnea.  CARDIOVASCULAR: No chest pain. No orthopnea. No palpitations. No syncope.  GASTROINTESTINAL: No nausea, no vomiting or diarrhea. No abdominal pain. No melena or hematochezia.  GENITOURINARY: No dysuria or hematuria.  ENDOCRINE: No polyuria or nocturia. No heat or cold intolerance.  HEMATOLOGY: No anemia. No bruising. No bleeding.  INTEGUMENTARY: No rashes. No lesions.  MUSCULOSKELETAL: No arthritis. No swelling. No gout.  NEUROLOGIC: No numbness, tingling, or ataxia. No seizure-type activity.  PSYCHIATRIC: No anxiety. No insomnia. No ADD.    Vitals:   Vitals:   04/20/17 2038 04/20/17 2041 04/21/17 0529 04/21/17 0751  BP: (!) 88/56 106/85 111/75 121/80  Pulse: (!) 36 61 (!) 142 70  Resp:      Temp: 98.3 F (36.8 C)  97.6 F (36.4 C) (!) 97.4 F (36.3 C)  TempSrc: Oral  Oral Oral  SpO2: 93% 93% 91% 94%  Weight:   217 lb 3.2 oz (98.5 kg)   Height:        Wt Readings from Last 3 Encounters:  04/21/17 217 lb 3.2 oz (98.5 kg)  12/12/16 205 lb (93 kg)  01/31/15 198 lb 14.4 oz (90.2 kg)     Intake/Output Summary (Last 24 hours) at 04/21/2017 1646 Last data  filed at 04/21/2017 1029 Gross per 24 hour  Intake 480 ml  Output 1700 ml  Net -1220 ml    Physical Exam:   GENERAL: Pleasant-appearing in no apparent distress.  HEAD, EYES, EARS, NOSE AND THROAT: Atraumatic, normocephalic. Extraocular muscles are intact. Pupils equal and reactive to light. Sclerae anicteric. No conjunctival injection. No oro-pharyngeal erythema.  NECK: Supple. There is no jugular venous distention. No bruits, no lymphadenopathy, no thyromegaly.  HEART: Irregularly irregular. No murmurs, no rubs, no clicks.  LUNGS: Clear to auscultation bilaterally. No rales or rhonchi.  No wheezes.  ABDOMEN: Soft, flat, nontender, nondistended. Has good bowel sounds. No hepatosplenomegaly appreciated.  EXTREMITIES: No evidence of any cyanosis, clubbing, or peripheral edema.  +2 pedal and radial pulses bilaterally.  NEUROLOGIC: The patient is alert, awake, and oriented x3 with no focal motor or sensory deficits appreciated bilaterally.  Bilateral hand tremor. SKIN: Moist and warm with no rashes appreciated.  Psych: Not anxious, depressed LN: No inguinal LN enlargement    Antibiotics   Anti-infectives (From admission, onward)   None      Medications   Scheduled Meds: . carbidopa-levodopa  2 tablet Oral TID WC  . carvedilol  3.125 mg Oral BID WC  . cholecalciferol  1,000 Units Oral Daily  . docusate sodium  100 mg Oral BID  . fluticasone  2 spray Each Nare Daily  . furosemide  20 mg Intravenous BID  . heparin  5,000 Units Subcutaneous Q8H  . latanoprost  1 drop Both Eyes QHS  . midodrine  5 mg Oral TID WC  . pantoprazole (PROTONIX) IV  40 mg Intravenous Q24H  . pneumococcal 23 valent vaccine  0.5 mL Intramuscular Tomorrow-1000  . rOPINIRole  1 mg Oral TID  . sodium chloride flush  3 mL Intravenous Q12H  . sodium chloride flush  3 mL Intravenous Q12H  . vitamin B-12  1,000 mcg Oral Daily   Continuous Infusions: . sodium chloride     PRN Meds:.sodium chloride,  acetaminophen **OR** acetaminophen, albuterol, bisacodyl, bisacodyl, HYDROcodone-acetaminophen, ondansetron **OR** ondansetron (ZOFRAN) IV, polyethylene glycol, sodium chloride flush   Data Review:   Micro Results No results found for this or any previous visit (from the past 240 hour(s)).  Radiology Reports Dg Chest 1 View  Result Date: 04/19/2017 CLINICAL DATA:  Shortness of breath and lower extremity edema EXAM: CHEST  1 VIEW COMPARISON:  None. FINDINGS: Unchanged cardiomegaly with mild interstitial pulmonary edema. No pleural effusion or pneumothorax. No focal consolidation. IMPRESSION: Cardiomegaly and mild interstitial pulmonary edema. Electronically Signed   By: Deatra Robinson M.D.   On: 04/19/2017 19:59     CBC Recent Labs  Lab 04/19/17 1849 04/20/17 0454  WBC 4.6 4.4  HGB 15.1 16.3*  HCT 46.5 49.5*  PLT 176 178  MCV 96.9 96.2  MCH 31.6 31.6  MCHC 32.6 32.9  RDW 15.4* 15.3*    Chemistries  Recent Labs  Lab 04/19/17 1849 04/20/17 0454  NA 143 139  K 3.7 3.6  CL 105 102  CO2 28 26  GLUCOSE 116* 161*  BUN 15 12  CREATININE 0.95 0.80  CALCIUM 9.9 9.5  AST 24  --   ALT 19  --   ALKPHOS 61  --   BILITOT 0.9  --    ------------------------------------------------------------------------------------------------------------------ estimated creatinine clearance is 59.7 mL/min (by C-G formula based on SCr of 0.8 mg/dL). ------------------------------------------------------------------------------------------------------------------ No results for input(s): HGBA1C in the last 72 hours. ------------------------------------------------------------------------------------------------------------------ No results for input(s): CHOL, HDL, LDLCALC, TRIG, CHOLHDL, LDLDIRECT in the last 72 hours. ------------------------------------------------------------------------------------------------------------------ Recent Labs    04/20/17 0454  TSH 3.408    ------------------------------------------------------------------------------------------------------------------ No results for input(s): VITAMINB12, FOLATE, FERRITIN, TIBC, IRON, RETICCTPCT in the last 72 hours.  Coagulation profile No results for input(s): INR, PROTIME in the last 168 hours.  No results for input(s): DDIMER in the last 72 hours.  Cardiac Enzymes Recent Labs  Lab 04/20/17 0454 04/20/17 1034 04/20/17 1637  TROPONINI 0.03* <0.03 <0.03   ------------------------------------------------------------------------------------------------------------------ Invalid input(s): POCBNP    Assessment & Plan   1 acute  on chronic systolic and diastolic congestive heart failure (EF 30-35%) exacerbation secondary to A. fib with RVR Continue IV Lasix due to low blood pressure, midodrine was added. Continue Coreg. Add lisinopril if blood pressure allows.  2 acute on chronic paroxysmal A. fib with RVR  Heart rate much improved, continue Coreg  3 chronic Parkinson's disease  stable Continue Sinemet  4 chronic GERD without esophagitis Stable PPI daily  PT evaluation suggest home health and PT.  Discussed with the patient 2 sons.    Code Status Orders  (From admission, onward)        Start     Ordered   04/20/17 0428  Do not attempt resuscitation (DNR)  Continuous    Question Answer Comment  In the event of cardiac or respiratory ARREST Do not call a "code blue"   In the event of cardiac or respiratory ARREST Do not perform Intubation, CPR, defibrillation or ACLS   In the event of cardiac or respiratory ARREST Use medication by any route, position, wound care, and other measures to relive pain and suffering. May use oxygen, suction and manual treatment of airway obstruction as needed for comfort.      04/20/17 0427    Code Status History    Date Active Date Inactive Code Status Order ID Comments User Context   01/31/2015 2130 02/01/2015 2005 DNR [id number]   Oralia ManisWillis, David, MD ED    Advance Directive Documentation     Most Recent Value  Type of Advance Directive  Healthcare Power of Attorney, Living will  Pre-existing out of facility DNR order (yellow form or pink MOST form)  -  "MOST" Form in Place?  -           Consults cardiology  DVT Prophylaxis continue heparin Lab Results  Component Value Date   PLT 178 04/20/2017     Time Spent in minutes   35 min Greater than 50% of time spent in care coordination and counseling patient regarding the condition and plan of care.   Shaune PollackQing Angee Gupton M.D on 04/21/2017 at 4:46 PM  Between 7am to 6pm - Pager - [phone]  After 6pm go to [email or web address] - Social research officer, governmentpassword [email or web address] [email or web address]  [employer]   Office  [phone]

## 2017-04-22 MED ORDER — CARVEDILOL 3.125 MG PO TABS: 3.1250 mg | ORAL_TABLET | Freq: Two times a day (BID) | ORAL | 1 refills | Status: AC

## 2017-04-22 MED ORDER — ENSURE ENLIVE PO LIQD
237.0000 mL | ORAL | Status: DC
  Administered 2017-04-22: 237 mL via ORAL

## 2017-04-22 MED ORDER — LOSARTAN POTASSIUM 25 MG PO TABS: 25.0000 mg | ORAL_TABLET | Freq: Every day | ORAL | 1 refills | Status: AC

## 2017-04-22 MED ORDER — LOSARTAN POTASSIUM 25 MG PO TABS
25.0000 mg | ORAL_TABLET | Freq: Every day | ORAL | Status: DC
  Administered 2017-04-22: 25 mg via ORAL
  Filled 2017-04-22: qty 1

## 2017-04-22 MED ORDER — FUROSEMIDE 20 MG PO TABS: 20.0000 mg | ORAL_TABLET | Freq: Two times a day (BID) | ORAL | 1 refills | Status: AC

## 2017-04-22 NOTE — Discharge Instructions (Signed)
Heart healthy and low sodium diet. HHPT Mech Soft diet (dys. Level 3) w/ gravy to moisten foods for easier mastication; Thin liquids via straw - monitoring and assisting. General aspiration precautions.   Medication Administration: Whole meds with puree(for safer, easier swallowing)

## 2017-04-22 NOTE — Care Management (Addendum)
Spoke with sons who are at beside about discharge.  They are adamant that home health agency to provide services [facility] even though agency can not provided the occupational therapy to make recommendations for adl aids.  Family has the catalog.  Family will transport patient home. No equipment needs.  [facility]. Agency will see patient within 48 hours of discharge and CAP social worker informed of discharge

## 2017-04-22 NOTE — Progress Notes (Addendum)
[facility] person assist per staff. RN requesting PT to re-evaluate before discharge. Ok to wait on PT before discharging per Dr. Imogene Burnhen.  *update - PT just saw patient (note to follow). Home Health still recommended. Family at bedside is comfortable with this decision. Will proceed with discharge as ordered. Home Health has been set up by care management.

## 2017-04-22 NOTE — Care Management Important Message (Signed)
Important Message  Patient Details  Name: Lydia Church MRN: [id number] Date of Birth: [birth year]   Medicare Important Message Given:  Yes Signed IM notice given    Eber HongGreene, Alija Riano R, RN 04/22/2017, 10:15 AM

## 2017-04-22 NOTE — Progress Notes (Signed)
Lydia PottsAnnie M Church to be D/C'd Home per MD order. Patient given discharge teaching and paperwork regarding medications, diet, follow-up appointments and activity. Patient understanding verbalized. No questions or complaints at this time. Skin condition as charted. IV and telemetry removed prior to leaving.  No further needs by Care Management/Social Work. Prescriptions given to son.   An After Visit Summary was printed and given to the patient. Caregiver/family present during discharge teaching.   Patient escorted via wheelchair by NT  Lydia Church, Lydia Church

## 2017-04-22 NOTE — Discharge Summary (Signed)
[facility] [place] [facility]   PATIENT NAME: Lydia Church    MR#:  [id number]  DATE OF BIRTH:  1931-01-16  DATE OF ADMISSION:  04/19/2017   ADMITTING PHYSICIAN: Evelena Asa Salary, MD  DATE OF DISCHARGE: 04/22/2017 11:27 AM  PRIMARY CARE PHYSICIAN: System, Pcp Not In   ADMISSION DIAGNOSIS:  Acute pulmonary edema (HCC) [J81.0] Leg edema [R60.0] SOB (shortness of breath) [R06.02] Atrial fibrillation with RVR (HCC) [I48.91] DISCHARGE DIAGNOSIS:  Active Problems:   A-fib (HCC)  SECONDARY DIAGNOSIS:   Past Medical History:  Diagnosis Date  . Anemia   . Arthritis   . Cataract   . Depression    never been treated for depression, has felt that way since Parkinson's   . GERD (gastroesophageal reflux disease)    uses gas ex as needed  . Glaucoma   . Headache(784.0)    because sinus problems  . Hypertension   . Legally blind in right eye, as defined in [place]   . 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 fib with RVR The patient has been treated with IV Lasix. Due to low blood pressure, midodrine was added.   Blood pressure is normal. Continue Coreg and losartan.   2acute on chronic paroxysmal A. fib with RVR  Heart rate much improved, continue Coreg  3chronic Parkinson's disease  stable Continue Sinemet  4chronic GERD without esophagitis Stable PPI daily  PT evaluation suggest home health and PT.  Discussed with the patient 2 sons. DISCHARGE CONDITIONS:  Stable, discharged to home with home health and PT today. CONSULTS OBTAINED:  Treatment Team:  Marcina Millard, MD DRUG ALLERGIES:  No Known Allergies DISCHARGE MEDICATIONS:   Allergies as of 04/22/2017   No Known Allergies     Medication List    TAKE these medications   bisacodyl 5 MG EC tablet Commonly known as:  DULCOLAX Take 1 tablet (5 mg total) by mouth  daily as needed for moderate constipation.   carbidopa-levodopa 25-100 MG tablet Commonly known as:  SINEMET IR Take 2 tablets by mouth 3 (three) times daily with meals.   carvedilol 3.125 MG tablet Commonly known as:  COREG Take 1 tablet (3.125 mg total) by mouth 2 (two) times daily with a meal.   cetirizine 10 MG tablet Commonly known as:  ZYRTEC Take 10 mg by mouth daily as needed (congestion).   fluticasone 50 MCG/ACT nasal spray Commonly known as:  FLONASE Place 2 sprays into both nostrils daily.   furosemide 20 MG tablet Commonly known as:  LASIX Take 1 tablet (20 mg total) by mouth 2 (two) times daily. What changed:  when to take this   losartan 25 MG tablet Commonly known as:  COZAAR Take 1 tablet (25 mg total) by mouth daily. Start taking on:  04/23/2017 What changed:    medication strength  how much to take   LUMIGAN 0.01 % Soln Generic drug:  bimatoprost Place 1 drop into both eyes every evening.   ondansetron 4 MG disintegrating tablet Commonly known as:  ZOFRAN ODT Take 1 tablet (4 mg total) by mouth every 8 (eight) hours as needed for nausea or vomiting.   rOPINIRole 1 MG tablet Commonly known as:  REQUIP Take 1 mg by mouth 3 (three) times daily.   SIMBRINZA 1-0.2 % Susp Generic drug:  Brinzolamide-Brimonidine Place 1 drop into the left eye 3 (three) times  daily.   VENTOLIN HFA 108 (90 Base) MCG/ACT inhaler Generic drug:  albuterol Inhale 2 puffs into the lungs every 4 (four) hours as needed.   vitamin B-12 1000 MCG tablet Commonly known as:  CYANOCOBALAMIN Take 1,000 mcg by mouth daily.   VITAMIN D-1000 MAX ST 1000 units tablet Generic drug:  Cholecalciferol Take 1 tablet by mouth daily.        DISCHARGE INSTRUCTIONS:  See AVS.  If you experience worsening of your admission symptoms, develop shortness of breath, life threatening emergency, suicidal or homicidal thoughts you must seek medical attention immediately by calling 911 or  calling your MD immediately  if symptoms less severe.  You Must read complete instructions/literature along with all the possible adverse reactions/side effects for all the Medicines you take and that have been prescribed to you. Take any new Medicines after you have completely understood and accpet all the possible adverse reactions/side effects.   Please note  You were cared for by a hospitalist during your hospital stay. If you have any questions about your discharge medications or the care you received while you were in the hospital after you are discharged, you can call the unit and asked to speak with the hospitalist on call if the hospitalist that took care of you is not available. Once you are discharged, your primary care physician will handle any further medical issues. Please note that NO REFILLS for any discharge medications will be authorized once you are discharged, as it is imperative that you return to your primary care physician (or establish a relationship with a primary care physician if you do not have one) for your aftercare needs so that they can reassess your need for medications and monitor your lab values.    On the day of Discharge:  VITAL SIGNS:  Blood pressure 122/68, pulse (!) 52, temperature 98.1 F (36.7 C), resp. rate 18, height 5\' 4"  (1.626 m), weight 219 lb 3.2 oz (99.4 kg), SpO2 96 %. PHYSICAL EXAMINATION:  GENERAL:  84 y.o.-year-old patient lying in the bed with no acute distress.  EYES: Pupils equal, round, reactive to light and accommodation. No scleral icterus. Extraocular muscles intact.  HEENT: Head atraumatic, normocephalic. Oropharynx and nasopharynx clear.  NECK:  Supple, no jugular venous distention. No thyroid enlargement, no tenderness.  LUNGS: Normal breath sounds bilaterally, no wheezing, rales,rhonchi or crepitation. No use of accessory muscles of respiration.  CARDIOVASCULAR: S1, S2 normal. No murmurs, rubs, or gallops.  ABDOMEN: Soft,  non-tender, non-distended. Bowel sounds present. No organomegaly or mass.  EXTREMITIES: No pedal edema, cyanosis, or clubbing.  NEUROLOGIC: Cranial nerves II through XII are intact. Muscle strength 4/5 in all extremities. Sensation intact. Gait not checked.  PSYCHIATRIC: The patient is alert and oriented x 3.  SKIN: No obvious rash, lesion, or ulcer.  DATA REVIEW:   CBC Recent Labs  Lab 04/20/17 0454  WBC 4.4  HGB 16.3*  HCT 49.5*  PLT 178    Chemistries  Recent Labs  Lab 04/19/17 1849 04/20/17 0454  NA 143 139  K 3.7 3.6  CL 105 102  CO2 28 26  GLUCOSE 116* 161*  BUN 15 12  CREATININE 0.95 0.80  CALCIUM 9.9 9.5  AST 24  --   ALT 19  --   ALKPHOS 61  --   BILITOT 0.9  --      Microbiology Results  No results found for this or any previous visit.  RADIOLOGY:  No results found.   Management plans  discussed with the patient, family and they are in agreement.  CODE STATUS: DNR   TOTAL TIME TAKING CARE OF THIS PATIENT: 35 minutes.    Shaune Pollack M.D on 04/22/2017 at 1:44 PM  Between 7am to 6pm - Pager - [phone]  After 6pm go to [email or web address] - Social research officer, government  [facility]  Office  [phone]  CC: Primary care physician; System, Pcp Not In   Note: This dictation was prepared with Dragon dictation along with smaller phrase technology. Any transcriptional errors that result from this process are unintentional.

## 2017-04-22 NOTE — Progress Notes (Signed)
Physical Therapy Treatment Patient Details Name: Lydia Church Chagnon MRN: [id number] DOB: 1930-08-31 Today's Date: 04/22/2017    History of Present Illness Patientis a86 y.o.femalewith a known historyper below presented to the emergency room with 2-week history of dizziness, worsening lower extremity edema, intermittent shortness of breath, in the emergency room patient was found to have acute exacerbation of paroxysmal A. fib with RVR, heart rate fluctuating between 45-134, tachypnea, BNP greater than 200, chest x-ray noted for edema.  Pt. admitted for acute on chronic diastolic congestive heart failure exacerbation secondary to A. fib with RVR.    PT Comments    Pt on bedpan upon arrival.  Rolling with min assist to remove.  No results.  To edge of bed with min assist.  Stood with min a x 2 and transferred to recliner at bedside with min a x 2 and heavy verbal cues to move feet but no buckling noted.    Daughter in law who is a primary caregiver was in for session.  Stated they have all equipment needed at home and feels limited mobility here is due more due to vision and a different set up at home.  She observed transfer and stated she felt comfortable taking pt home and will have a second assist as needed.  She has a wheelchair and ramp to access home, RW and grab bars through out home.  Discussed with primary nurse.   Follow Up Recommendations  Home health PT     Equipment Recommendations       Recommendations for Other Services       Precautions / Restrictions Precautions Precautions: Fall Restrictions Weight Bearing Restrictions: No    Mobility  Bed Mobility Overal bed mobility: Needs Assistance Bed Mobility: Supine to Sit Rolling: Min assist Sidelying to sit: Min assist          Transfers Overall transfer level: Needs assistance Equipment used: Rolling walker (2 wheeled) Transfers: Sit to/from Stand Sit to Stand: Min assist;+2 physical assistance;+2  safety/equipment         General transfer comment: no dizziness today  Ambulation/Gait Ambulation/Gait assistance: Min assist;+2 physical assistance Ambulation Distance (Feet): 3 Feet Assistive device: Rolling walker (2 wheeled)     Gait velocity interpretation: <1.8 ft/sec, indicative of risk for recurrent falls General Gait Details: heavy cues to move feet but no LE's buckiling or unsteadiness   Stairs            Wheelchair Mobility    Modified Rankin (Stroke Patients Only)       Balance Overall balance assessment: Needs assistance Sitting-balance support: Single extremity supported;Feet supported Sitting balance-Leahy Scale: Fair     Standing balance support: Bilateral upper extremity supported Standing balance-Leahy Scale: Fair                              Cognition Arousal/Alertness: Awake/alert Behavior During Therapy: WFL for tasks assessed/performed Overall Cognitive Status: Within Functional Limits for tasks assessed                                        Exercises      General Comments        Pertinent Vitals/Pain Pain Assessment: Faces Faces Pain Scale: Hurts a little bit Pain Location: back from immobility Pain Descriptors / Indicators: Sore Pain Intervention(s): Repositioned    Home Living  Prior Function            PT Goals (current goals can now be found in the care plan section) Progress towards PT goals: Progressing toward goals    Frequency           PT Plan Current plan remains appropriate    Co-evaluation              AM-PAC PT "6 Clicks" Daily Activity  Outcome Measure  Difficulty turning over in bed (including adjusting bedclothes, sheets and blankets)?: A Lot Difficulty moving from lying on back to sitting on the side of the bed? : Unable Difficulty sitting down on and standing up from a chair with arms (e.g., wheelchair, bedside commode,  etc,.)?: Unable Help needed moving to and from a bed to chair (including a wheelchair)?: A Little Help needed walking in hospital room?: A Lot Help needed climbing 3-5 steps with a railing? : A Lot 6 Click Score: 11    End of Session Equipment Utilized During Treatment: Gait belt Activity Tolerance: Patient tolerated treatment well Patient left: in chair;with chair alarm set;with call bell/phone within reach;with family/visitor present Nurse Communication: Mobility status       Time: 1610-9604 PT Time Calculation (min) (ACUTE ONLY): 12 min  Charges:  $Therapeutic Activity: 8-22 mins                    G Codes:       Danielle Dess, PTA 04/22/17, 11:13 AM

## 2019-10-07 IMAGING — CT CT ABD-PELV W/ CM
2 of 5 series · 16 of 46 positions shown, 18 images · IV contrast (APPLIED)
Comparison: 05/31/2012

CLINICAL DATA: Pt states upper middle abd pain and points to
epigastric area. Pt states nausea but denies vomiting. States BM
today but states that she still feels constipated. States had BM
yesterday as well. Pt has parkinson's, shakes at baseline.

EXAM:
CT ABDOMEN AND PELVIS WITH CONTRAST
TECHNIQUE: Multidetector CT imaging of the abdomen and pelvis was performed
using the standard protocol following bolus administration of
intravenous contrast.
CONTRAST:  100mL [id number] IOPAMIDOL ([id number]) INJECTION 61%

[Series 2: routine abd/pel with · axial · 0.98mm/px · z∈[-372,+23]mm · 13 of 89 slices shown, 15 images]
[im 5/89  soft-tissue]
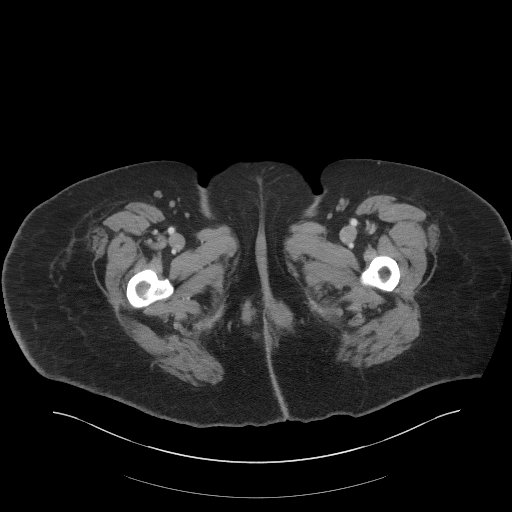
[im 5/89  bone]
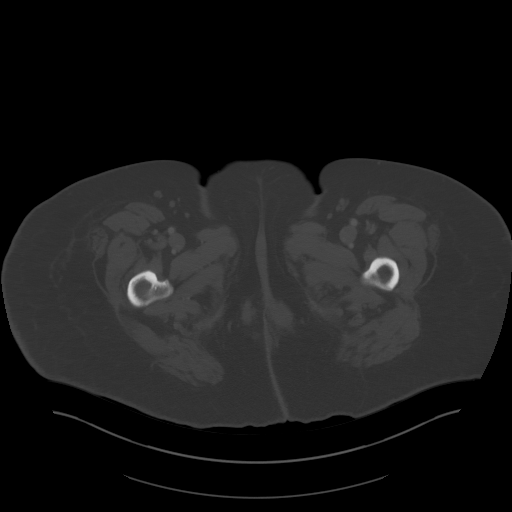
[im 14/89  soft-tissue]
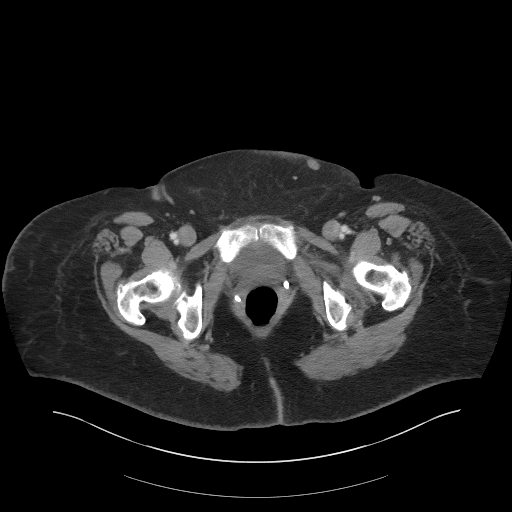
[im 19/89  soft-tissue]
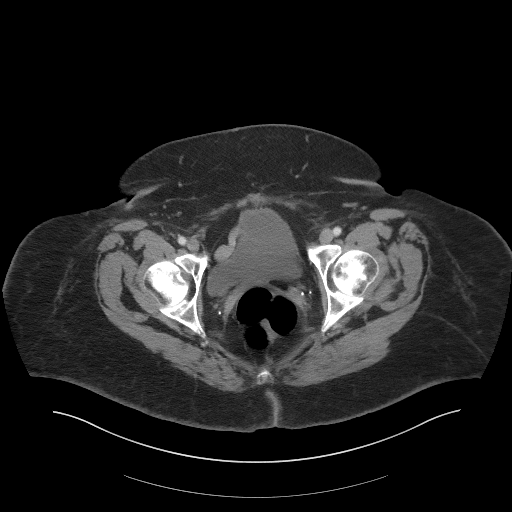
[im 24/89  soft-tissue]
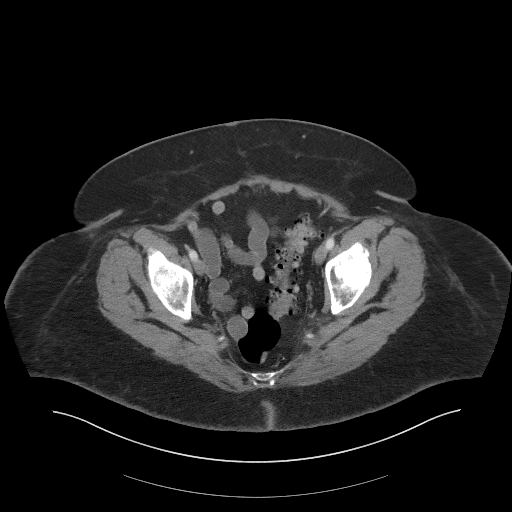
[im 33/89  soft-tissue]
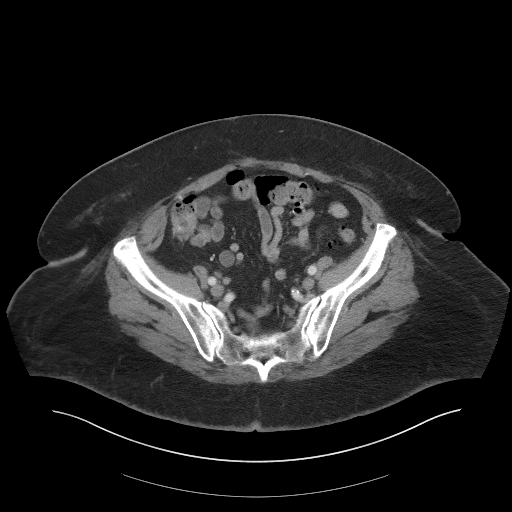
[im 38/89  soft-tissue]
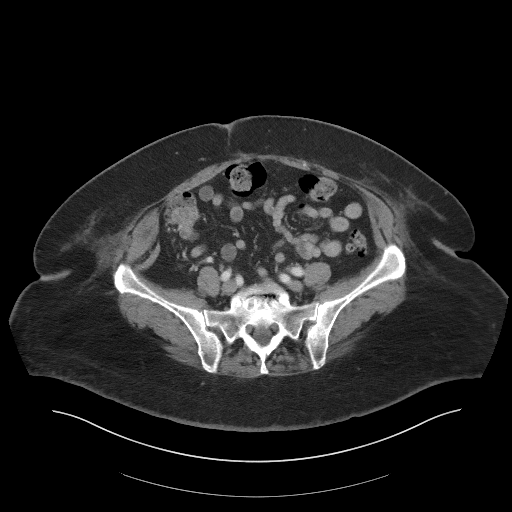
[im 47/89  soft-tissue]
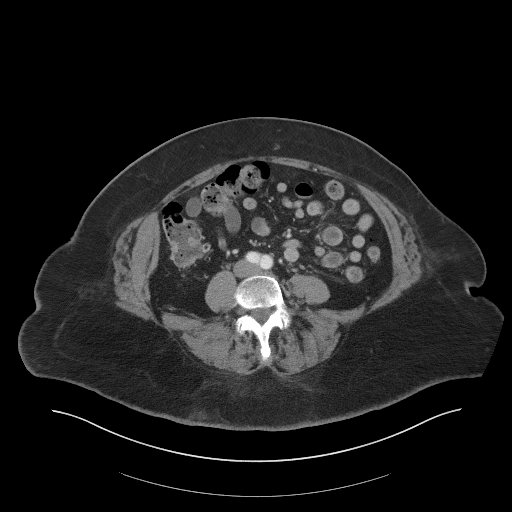
[im 51/89  soft-tissue]
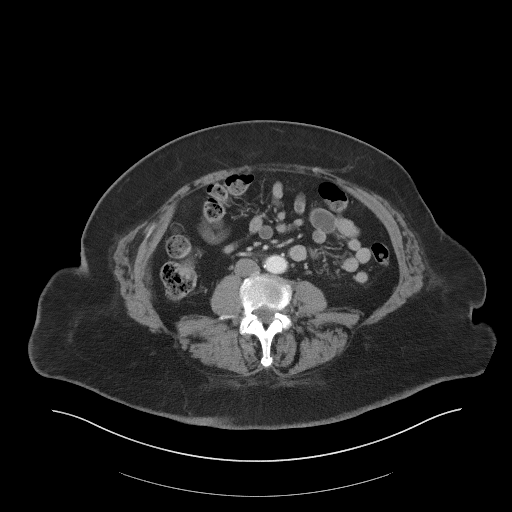
[im 56/89  soft-tissue]
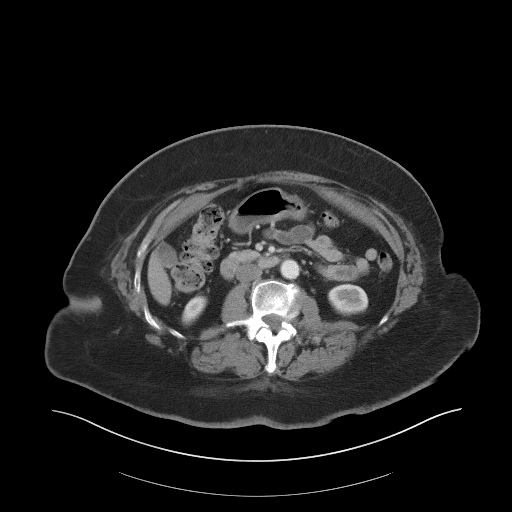
[im 56/89  bone]
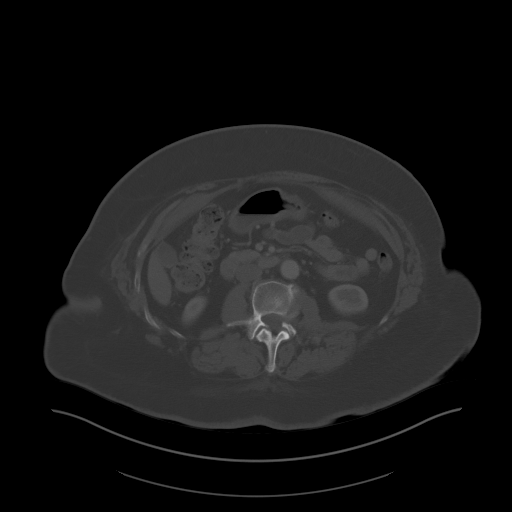
[im 65/89  soft-tissue]
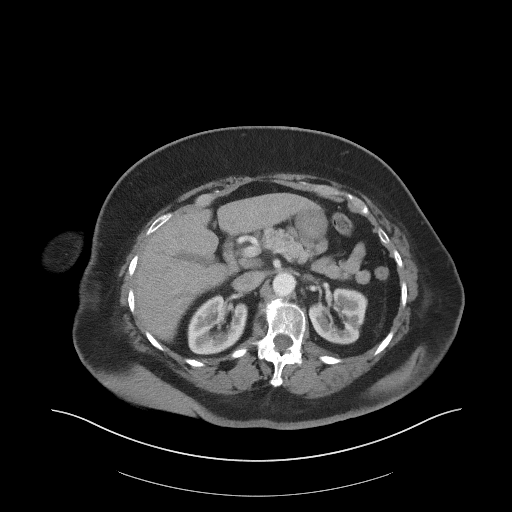
[im 70/89  soft-tissue]
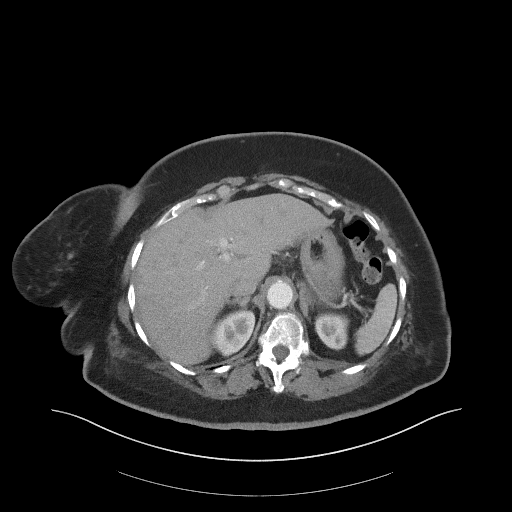
[im 75/89  soft-tissue]
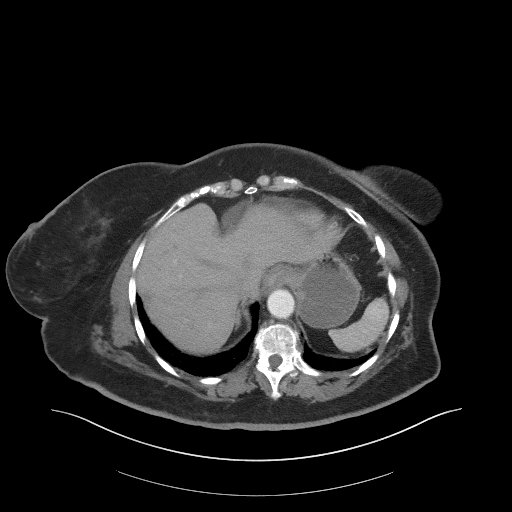
[im 84/89  soft-tissue]
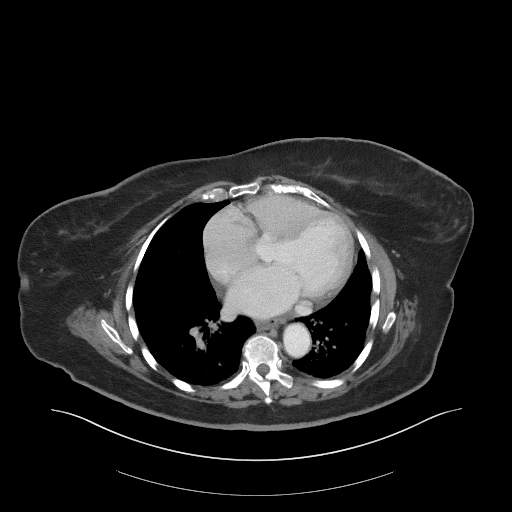

[Series 5: coronal st · coronal · 0.91mm/px · 3 of 105 slices shown]
[im 35/105  soft-tissue]
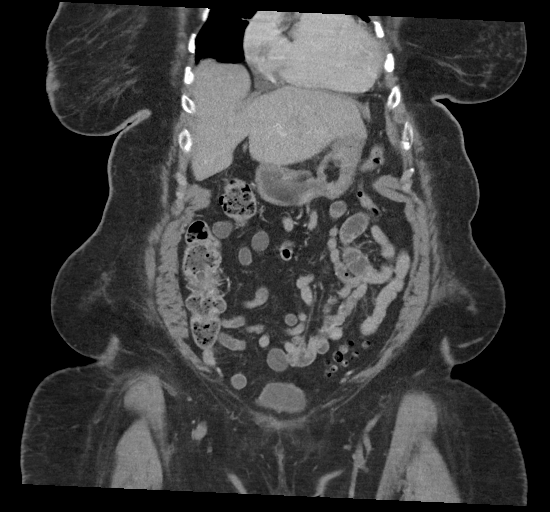
[im 47/105  soft-tissue]
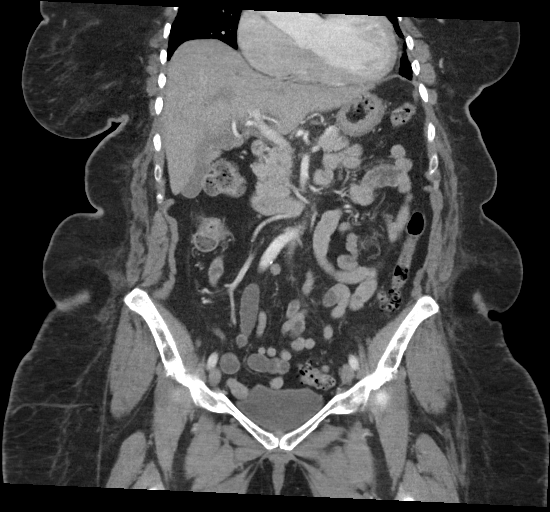
[im 58/105  soft-tissue]
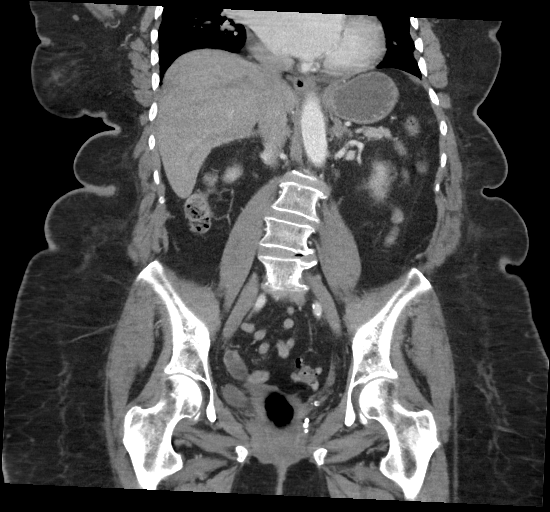

[16 of 46 positions shown; findings below may reference images not displayed]

FINDINGS: Lower chest: No acute findings.  Heart is enlarged.

Hepatobiliary: No focal liver abnormality is seen. No gallstones,
gallbladder wall thickening, or biliary dilatation.

Pancreas: Unremarkable. No pancreatic ductal dilatation or
surrounding inflammatory changes.

Spleen: Normal in size without focal abnormality.

Adrenals/Urinary Tract: No adrenal masses. Small nonobstructing
stone in the upper pole the right kidney with a tiny nonobstructing
stone in the lower pole of the left kidney. Renal sinus cysts are
noted along the medial aspect of the mid to lower pole the left
kidney. No other renal masses. There is no hydronephrosis. Ureters
are normal in course and in caliber. Bladder is unremarkable.

Stomach/Bowel: Stomach and small bowel unremarkable. There are
numerous colonic diverticula. No diverticulitis or other colon
inflammatory process. Normal appendix visualized.

Vascular/Lymphatic: Aortic atherosclerosis. No enlarged abdominal or
pelvic lymph nodes.

Reproductive: Status post hysterectomy. No adnexal masses.

Other: No ascites.

Musculoskeletal: No fracture or acute finding. No osteoblastic or
osteolytic lesions.
IMPRESSION: 1. No acute findings.
2. Small nonobstructing stones in each kidney. Left renal sinus
cysts, stable from the prior CT.
3. Colonic diverticulosis without evidence of diverticulitis.
4. Cardiomegaly.
5. Mild aortic atherosclerosis.

## 2019-10-07 IMAGING — CR DG CHEST 2V
2 series · 2 of 2 positions shown · non-contrast
Comparison: Chest x-ray dated September 01, 2013.

CLINICAL DATA: Cough and shortness of breath.

EXAM:
CHEST  2 VIEW

[chest lat]
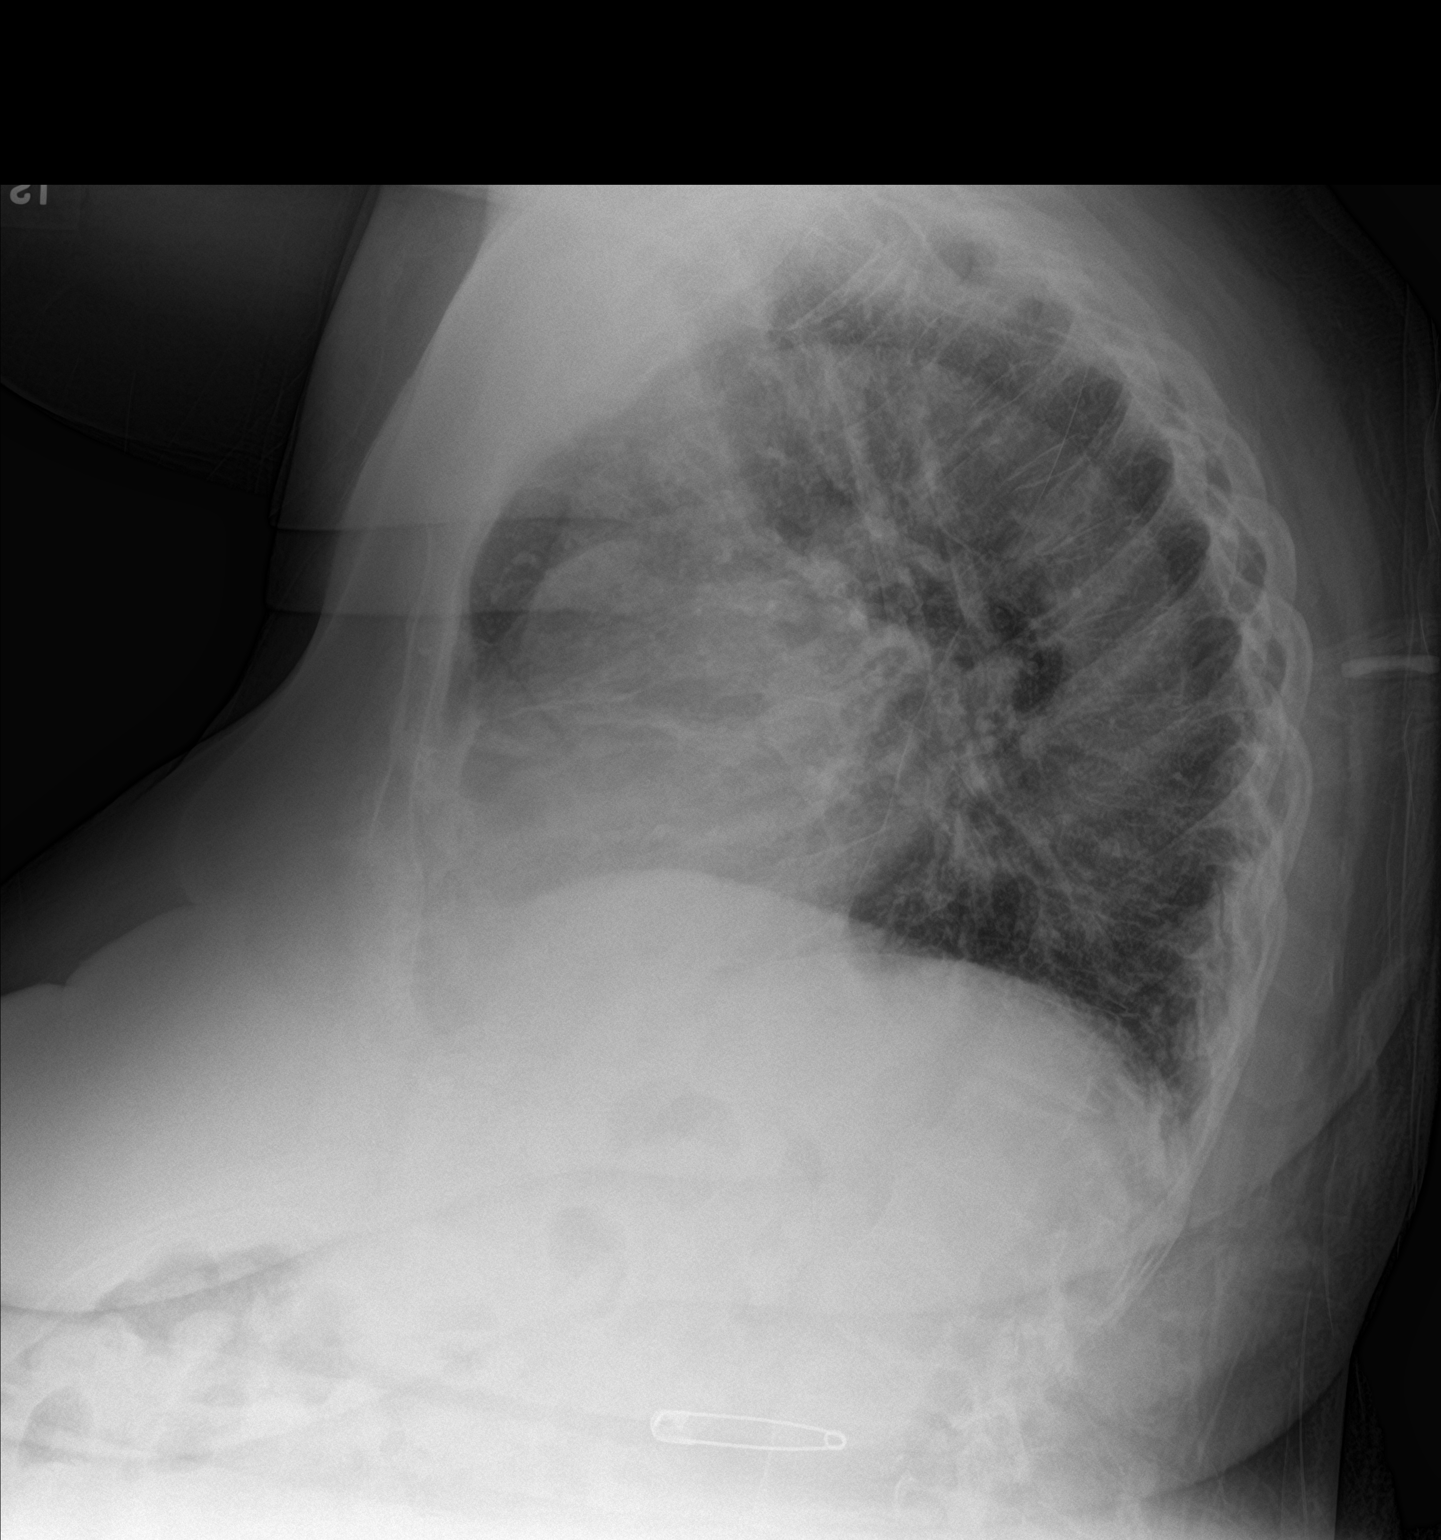

[chest ap]
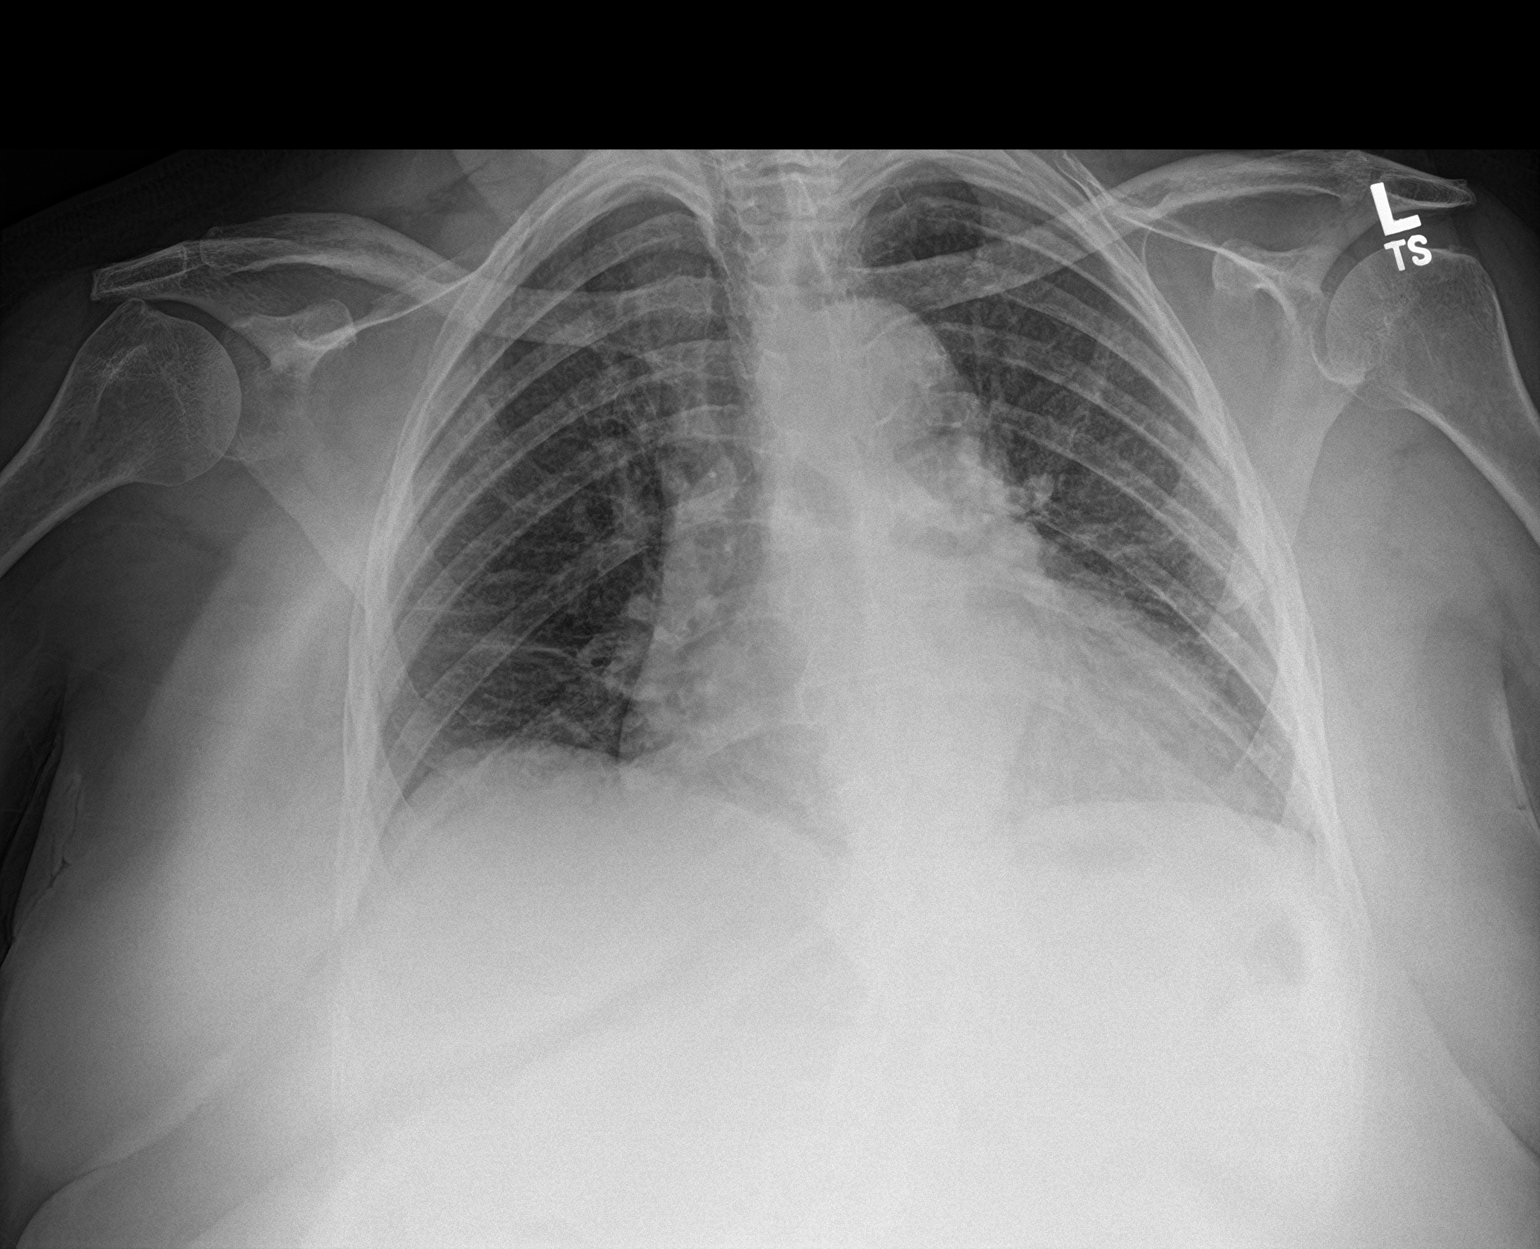

[2 of 2 positions shown; findings below may reference images not displayed]

FINDINGS: Mild cardiomegaly, unchanged. Persistent mild pulmonary vascular
congestion. Atherosclerotic calcification of the aortic arch. No
focal consolidation, pleural effusion, or pneumothorax. No acute
osseous abnormality.
IMPRESSION: Stable cardiomegaly and chronic pulmonary vascular congestion. No
definite acute superimposed process.

## 2020-01-22 DEATH — deceased
